# Patient Record
Sex: Male | Born: 1955 | Race: White | Hispanic: No | Marital: Married | Smoking: Never smoker
Health system: Southern US, Community
[De-identification: ages and names within clinical notes are randomized; demographics above are authoritative.]

## PROBLEM LIST (undated history)

## (undated) DIAGNOSIS — E039 Hypothyroidism, unspecified: Secondary | ICD-10-CM

## (undated) DIAGNOSIS — Z9889 Other specified postprocedural states: Secondary | ICD-10-CM

## (undated) DIAGNOSIS — I10 Essential (primary) hypertension: Secondary | ICD-10-CM

## (undated) DIAGNOSIS — R112 Nausea with vomiting, unspecified: Secondary | ICD-10-CM

## (undated) DIAGNOSIS — R0602 Shortness of breath: Secondary | ICD-10-CM

## (undated) DIAGNOSIS — M1990 Unspecified osteoarthritis, unspecified site: Secondary | ICD-10-CM

## (undated) HISTORY — PX: OTHER SURGICAL HISTORY: SHX169

## (undated) HISTORY — PX: TONSILLECTOMY: SUR1361

---

## 2012-02-11 ENCOUNTER — Other Ambulatory Visit: Payer: Self-pay | Admitting: Orthopedic Surgery

## 2012-02-11 MED ORDER — BUPIVACAINE LIPOSOME 1.3 % IJ SUSP: 20.0000 mL | Freq: Once | INTRAMUSCULAR | Status: DC

## 2012-02-11 MED ORDER — DEXAMETHASONE SODIUM PHOSPHATE 10 MG/ML IJ SOLN: 10.0000 mg | Freq: Once | INTRAMUSCULAR | Status: DC

## 2012-02-11 NOTE — Progress Notes (Signed)
Preoperative surgical orders have been place into the [facility] hospital system for Thomas Meyers on 02/11/2012, 10:44 AM  by [name] for surgery on 03/21/2012.  Preop Total Knee orders including Experal, IV Tylenol, and IV Decadron as long as there are no contraindications to the above medications. Avel Peace, PA-C .

## 2012-03-11 ENCOUNTER — Encounter (HOSPITAL_COMMUNITY): Payer: Self-pay | Admitting: Pharmacy Technician

## 2012-03-12 NOTE — Patient Instructions (Signed)
EUGUNE SINE  03/12/2012   Your procedure is scheduled on:  03/21/12  Report to [facility] at   1130  AM.  Call this number if you have problems the morning of surgery: [phone]   Remember:   Do not eat food or drink liquids after midnight.   Take these medicines the morning of surgery with A SIP OF WATER:    Do not wear jewelry,   Do not wear lotions, powders, or perfumes.   . Men may shave face and neck.  Do not bring valuables to the hospital.  Contacts, dentures or bridgework may not be worn into surgery.  Leave suitcase in the car. After surgery it may be brought to your room.  For patients admitted to the hospital, checkout time is 11:00 AM the day of  discharge.        SEE CHG INSTRUCTION SHEET    Please read over the following fact sheets that you were given: MRSA Information, coughing and deep breathing exercises, leg exercises, Blood Transfusion Fact sheet, Incentive Spirometry Fact sheet                Failure to comply with these instructions may result in cancellation of your surgery.                Patient Signature ____________________________              Nurse Signature _____________________________

## 2012-03-13 ENCOUNTER — Encounter (HOSPITAL_COMMUNITY)
Admission: RE | Admit: 2012-03-13 | Discharge: 2012-03-13 | Disposition: A | Discharge status: Home / Self Care | Payer: BC Managed Care – PPO | Source: Ambulatory Visit | Attending: Orthopedic Surgery | Admitting: Orthopedic Surgery

## 2012-03-13 ENCOUNTER — Encounter (HOSPITAL_COMMUNITY): Payer: Self-pay

## 2012-03-13 ENCOUNTER — Ambulatory Visit (HOSPITAL_COMMUNITY)
Admission: RE | Admit: 2012-03-13 | Discharge: 2012-03-13 | Disposition: A | Discharge status: Home / Self Care | Payer: BC Managed Care – PPO | Source: Ambulatory Visit | Attending: Orthopedic Surgery | Admitting: Orthopedic Surgery

## 2012-03-13 DIAGNOSIS — Z01810 Encounter for preprocedural cardiovascular examination: Secondary | ICD-10-CM | POA: Insufficient documentation

## 2012-03-13 DIAGNOSIS — I10 Essential (primary) hypertension: Secondary | ICD-10-CM | POA: Insufficient documentation

## 2012-03-13 DIAGNOSIS — M5380 Other specified dorsopathies, site unspecified: Secondary | ICD-10-CM | POA: Insufficient documentation

## 2012-03-13 DIAGNOSIS — Z01812 Encounter for preprocedural laboratory examination: Secondary | ICD-10-CM | POA: Insufficient documentation

## 2012-03-13 DIAGNOSIS — Z01818 Encounter for other preprocedural examination: Secondary | ICD-10-CM | POA: Insufficient documentation

## 2012-03-13 HISTORY — DX: Unspecified osteoarthritis, unspecified site: M19.90

## 2012-03-13 HISTORY — DX: Shortness of breath: R06.02

## 2012-03-13 HISTORY — DX: Essential (primary) hypertension: I10

## 2012-03-13 HISTORY — DX: Hypothyroidism, unspecified: E03.9

## 2012-03-13 LAB — COMPREHENSIVE METABOLIC PANEL
Albumin: 3.9 g/dL (ref 3.5–5.2)
BUN: 20 mg/dL (ref 6–23)
Chloride: 101 mEq/L (ref 96–112)
Creatinine, Ser: 0.88 mg/dL (ref 0.50–1.35)
GFR calc Af Amer: >90 mL/min (ref >90)
GFR calc non Af Amer: >90 mL/min (ref >90)
Glucose, Bld: 96 mg/dL (ref 70–99)
Total Bilirubin: 0.6 mg/dL (ref 0.3–1.2)

## 2012-03-13 LAB — CBC
HCT: 42.9 % (ref 39.0–52.0)
MCHC: 33.6 g/dL (ref 30.0–36.0)
MCV: 92.1 fL (ref 78.0–100.0)
RDW: 13.2 % (ref 11.5–15.5)
WBC: 8.6 10*3/uL (ref 4.0–10.5)

## 2012-03-13 LAB — PROTIME-INR
INR: 1.07 (ref 0.00–1.49)
Prothrombin Time: 13.8 seconds (ref 11.6–15.2)

## 2012-03-13 LAB — URINALYSIS, ROUTINE W REFLEX MICROSCOPIC
Bilirubin Urine: NEGATIVE
Ketones, ur: NEGATIVE mg/dL
Nitrite: NEGATIVE
Protein, ur: NEGATIVE mg/dL
Specific Gravity, Urine: 1.014 (ref 1.005–1.030)
Urobilinogen, UA: 0.2 mg/dL (ref 0.0–1.0)

## 2012-03-18 ENCOUNTER — Other Ambulatory Visit: Payer: Self-pay | Admitting: Orthopedic Surgery

## 2012-03-18 NOTE — H&P (Signed)
Thomas Meyers  DOB: 04/20/1955 Married / Language: English / Race: White Male  Date of Admission:  03/21/2012  Chief Complaint:  Right Knee Pain  History of Present Illness The patient is a 57 year old male who comes in  for a preoperative History and Physical. The patient is scheduled for a right total knee arthroplasty to be performed by Dr. Gus Rankin. Aluisio, MD at [facility] on 03/21/2012. The patient is a 57 year old male who presents for follow up of their knee. The patient is being followed for their bilateral knee pain and osteoarthritis. Symptoms reported today include: pain. The patient feels that they are doing poorly and report their pain level to be moderate to severe. The following medication has been used for pain control: Tylenol. He has had cortisone injections in the past as well as Synvisc. He completed his Synvisc series in the beginning of October. The left knee did respond somewhat to the injections, although he states that pain has come back. He states he feels he is still getting some relief from the Synvisc into the left knee, but he does have pain every day with weightbearing activity on the left knee. The right knee did not respond to the visco supplementation. He has significant pain with weightbearing and even at rest with the right knee. There is a lot of trouble with instability of the right knee. He has been wearing a brace which does help him feel more stable on that right knee. He is scheduled to have the right knee replaced on 03-21-12 and is ready to procedd with surgery. They have been treated conservatively in the past for the above stated problem and despite conservative measures, they continue to have progressive pain and severe functional limitations and dysfunction. They have failed non-operative management including home exercise, medications, and injections. It is felt that they would benefit from undergoing total joint replacement. Risks and  benefits of the procedure have been discussed with the patient and they elect to proceed with surgery. There are no active contraindications to surgery such as ongoing infection or rapidly progressive neurological disease.   Problem List Primary osteoarthritis of both knees (715.16)   Allergies No Known Drug Allergies   Family History Rheumatoid Arthritis. mother Hypertension. mother and father Cancer. mother and grandfather fathers side Heart Disease. father Father. Deceased, Hypertension, Alzheimer's disease, CABG, Coronary artery disease. age 73 Mother. Deceased, Cancer, Hypertension. age 1   Social History Marital status. married Number of flights of stairs before winded. 4-5 Pain Contract. no Exercise. Exercises rarely Illicit drug use. no Living situation. live with spouse Tobacco / smoke exposure. no Tobacco use. never smoker Drug/Alcohol Rehab (Previously). no Current work status. working full time Drug/Alcohol Rehab (Currently). no Children. 1 Alcohol use. current drinker; drinks beer, wine and hard liquor; only occasionally per week Post-Surgical Plans. Plan is for home.   Medication History Losartan Potassium-HCTZ (100-12.5MG  Tablet, Oral) Active. Metoprolol Succinate ER (50MG  Tablet ER 24HR, Oral) Active. Levothyroxine Sodium ( Tablet, Oral) Active. Pravastatin Sodium (40MG  Tablet, Oral) Active. Aspirin Low Dose (81MG  Tablet, Oral) Active. Tylenol Arthritis Pain ( Oral) Specific dose unknown - Active. Multivitamin ( Oral) Active.   Past Surgical History Arthroscopy of Knee. Date: 36. right   Medical History High blood pressure Hypercholesterolemia Hypothyroidism   Review of Systems General:Present- Chills (couple of weeks ago but none recently). Not Present- Fever, Night Sweats, Fatigue, Weight Gain, Weight Loss and Memory Loss. Skin:Not Present- Hives, Itching, Rash, Eczema and Lesions.  HEENT:Not  Present- Tinnitus, Headache, Double Vision, Visual Loss, Hearing Loss and Dentures. Respiratory:Not Present- Shortness of breath with exertion, Shortness of breath at rest, Allergies, Coughing up blood and Chronic Cough. Cardiovascular:Not Present- Chest Pain, Racing/skipping heartbeats, Difficulty Breathing Lying Down, Murmur, Swelling and Palpitations. Gastrointestinal:Not Present- Bloody Stool, Heartburn, Abdominal Pain, Vomiting, Nausea, Constipation, Diarrhea, Difficulty Swallowing, Jaundice and Loss of appetitie. Male Genitourinary:Not Present- Urinary frequency, Blood in Urine, Weak urinary stream, Discharge, Flank Pain, Incontinence, Painful Urination, Urgency, Urinary Retention and Urinating at Night. Musculoskeletal:Present- Joint Pain. Not Present- Muscle Weakness, Muscle Pain, Joint Swelling, Back Pain, Morning Stiffness and Spasms. Neurological:Not Present- Tremor, Dizziness, Blackout spells, Paralysis, Difficulty with balance and Weakness. Psychiatric:Not Present- Insomnia.   Vitals Weight: 270 lb Height: 71 in Body Surface Area: 2.48 m Body Mass Index: 37.66 kg/m Pulse: 76 (Regular) Resp.: 16 (Unlabored) BP: 132/80 (Sitting, Left Arm, Standard)    Physical Exam The physical exam findings are as follows:  Note: Patient is accompanied today by his wife.   General Mental Status - Alert, cooperative and good historian. General Appearance- pleasant. Not in acute distress. Orientation- Oriented X3. Build & Nutrition- Well nourished and Well developed.   Head and Neck Head- normocephalic, atraumatic . Neck Global Assessment- supple. no bruit auscultated on the right and no bruit auscultated on the left.   Eye Pupil- Bilateral- Regular and Round. Motion- Bilateral- EOMI.   Chest and Lung Exam Auscultation: Breath sounds:- clear at anterior chest wall and - clear at posterior chest wall. Adventitious sounds:- No Adventitious  sounds.   Cardiovascular Auscultation:Rhythm- Regular rate and rhythm. Heart Sounds- S1 WNL and S2 WNL. Murmurs & Other Heart Sounds:Auscultation of the heart reveals - No Murmurs.   Abdomen Palpation/Percussion:Tenderness- Abdomen is non-tender to palpation. Rigidity (guarding)- Abdomen is soft. Auscultation:Auscultation of the abdomen reveals - Bowel sounds normal.   Male Genitourinary Not done, not pertinent to present illness  Musculoskeletal On exam well developed male alert and oriented in no apparent distress. Evaluation of his left knee shows no effusion. His range of motion is about 0 to 125. Moderate crepitus on range of motion. He is tender medial greater than lateral with no instability. Right knee no effusion. Significant varus. Range 5 to 120. Marked crepitus on range of motion. Tender medial greater than lateral. No instability. Pulses, sensation and motor are intact both lower extremities.    RADIOGRAPHS: Radiographs show bone on bone arthritis medial and patellofemoral compartments of the right knee.  Assessment & Plan Osteoarthritis, Knee (715.96) Impression: Right Knee  Note: Plan is for a Right Total Knee Replacement by Dr. Lequita Halt.  Plan is to go home.  PCP - Dr. Wyvonnia Lora - Patient has been seen preoperatively and felt to be stable for surgery.  Signed electronically by Roberts Gaudy, PA-C

## 2012-03-21 ENCOUNTER — Inpatient Hospital Stay (HOSPITAL_COMMUNITY)
Admission: RE | Admit: 2012-03-21 | Discharge: 2012-03-23 | DRG: 209 | Disposition: A | Discharge status: Home Health | Payer: BC Managed Care – PPO | Source: Ambulatory Visit | Attending: Orthopedic Surgery | Admitting: Orthopedic Surgery

## 2012-03-21 ENCOUNTER — Inpatient Hospital Stay (HOSPITAL_COMMUNITY): Payer: BC Managed Care – PPO | Admitting: Anesthesiology

## 2012-03-21 ENCOUNTER — Encounter (HOSPITAL_COMMUNITY): Payer: Self-pay | Admitting: *Deleted

## 2012-03-21 ENCOUNTER — Encounter (HOSPITAL_COMMUNITY)
Admission: RE | Disposition: A | Discharge status: Home Health | Payer: Self-pay | Source: Ambulatory Visit | Attending: Orthopedic Surgery

## 2012-03-21 ENCOUNTER — Encounter (HOSPITAL_COMMUNITY): Payer: Self-pay | Admitting: Anesthesiology

## 2012-03-21 DIAGNOSIS — E7800 Pure hypercholesterolemia, unspecified: Secondary | ICD-10-CM | POA: Diagnosis present

## 2012-03-21 DIAGNOSIS — M1710 Unilateral primary osteoarthritis, unspecified knee: Principal | ICD-10-CM | POA: Diagnosis present

## 2012-03-21 DIAGNOSIS — Z7982 Long term (current) use of aspirin: Secondary | ICD-10-CM

## 2012-03-21 DIAGNOSIS — I10 Essential (primary) hypertension: Secondary | ICD-10-CM | POA: Diagnosis present

## 2012-03-21 DIAGNOSIS — E039 Hypothyroidism, unspecified: Secondary | ICD-10-CM | POA: Diagnosis present

## 2012-03-21 DIAGNOSIS — M179 Osteoarthritis of knee, unspecified: Secondary | ICD-10-CM | POA: Diagnosis present

## 2012-03-21 DIAGNOSIS — Z79899 Other long term (current) drug therapy: Secondary | ICD-10-CM

## 2012-03-21 DIAGNOSIS — Z8249 Family history of ischemic heart disease and other diseases of the circulatory system: Secondary | ICD-10-CM

## 2012-03-21 HISTORY — PX: TOTAL KNEE ARTHROPLASTY: SHX125

## 2012-03-21 LAB — ABO/RH: ABO/RH(D): AB POS

## 2012-03-21 LAB — TYPE AND SCREEN

## 2012-03-21 SURGERY — ARTHROPLASTY, KNEE, TOTAL
Anesthesia: Spinal | Site: Knee | Laterality: Right | Wound class: Clean

## 2012-03-21 MED ORDER — MIDAZOLAM HCL 5 MG/5ML IJ SOLN
INTRAMUSCULAR | Status: DC | PRN
  Administered 2012-03-21: 2 mg via INTRAVENOUS

## 2012-03-21 MED ORDER — METOPROLOL SUCCINATE ER 50 MG PO TB24
50.0000 mg | ORAL_TABLET | Freq: Every day | ORAL | Status: DC
  Filled 2012-03-21 (×2): qty 1

## 2012-03-21 MED ORDER — LOSARTAN POTASSIUM 50 MG PO TABS
100.0000 mg | ORAL_TABLET | Freq: Every day | ORAL | Status: DC
  Administered 2012-03-21: 100 mg via ORAL
  Filled 2012-03-21 (×3): qty 2

## 2012-03-21 MED ORDER — SODIUM CHLORIDE 0.9 % IV SOLN: INTRAVENOUS | Status: DC

## 2012-03-21 MED ORDER — METOCLOPRAMIDE HCL 5 MG/ML IJ SOLN: 10.0000 mg | Freq: Once | INTRAMUSCULAR | Status: DC | PRN

## 2012-03-21 MED ORDER — BISACODYL 10 MG RE SUPP: 10.0000 mg | Freq: Every day | RECTAL | Status: DC | PRN

## 2012-03-21 MED ORDER — OXYCODONE HCL 5 MG/5ML PO SOLN
5.0000 mg | Freq: Once | ORAL | Status: DC | PRN
  Filled 2012-03-21: qty 5

## 2012-03-21 MED ORDER — LACTATED RINGERS IV SOLN
INTRAVENOUS | Status: DC | PRN
  Administered 2012-03-21 (×3): via INTRAVENOUS

## 2012-03-21 MED ORDER — DEXAMETHASONE 6 MG PO TABS
10.0000 mg | ORAL_TABLET | Freq: Once | ORAL | Status: AC
  Administered 2012-03-22: 10 mg via ORAL
  Filled 2012-03-21: qty 1

## 2012-03-21 MED ORDER — SIMVASTATIN 20 MG PO TABS
20.0000 mg | ORAL_TABLET | Freq: Every day | ORAL | Status: DC
  Administered 2012-03-21 – 2012-03-22 (×2): 20 mg via ORAL
  Filled 2012-03-21 (×3): qty 1

## 2012-03-21 MED ORDER — BUPIVACAINE IN DEXTROSE 0.75-8.25 % IT SOLN
INTRATHECAL | Status: DC | PRN
  Administered 2012-03-21: 1.6 mL via INTRATHECAL

## 2012-03-21 MED ORDER — KCL IN DEXTROSE-NACL 20-5-0.9 MEQ/L-%-% IV SOLN
INTRAVENOUS | Status: DC
  Administered 2012-03-21: 17:00:00 via INTRAVENOUS
  Filled 2012-03-21 (×5): qty 1000

## 2012-03-21 MED ORDER — ACETAMINOPHEN 650 MG RE SUPP: 650.0000 mg | Freq: Four times a day (QID) | RECTAL | Status: DC | PRN

## 2012-03-21 MED ORDER — ACETAMINOPHEN 325 MG PO TABS: 650.0000 mg | ORAL_TABLET | Freq: Four times a day (QID) | ORAL | Status: DC | PRN

## 2012-03-21 MED ORDER — HYDROCHLOROTHIAZIDE 12.5 MG PO CAPS
12.5000 mg | ORAL_CAPSULE | Freq: Every day | ORAL | Status: DC
  Administered 2012-03-21: 12.5 mg via ORAL
  Filled 2012-03-21 (×3): qty 1

## 2012-03-21 MED ORDER — PHENOL 1.4 % MT LIQD: 1.0000 | OROMUCOSAL | Status: DC | PRN

## 2012-03-21 MED ORDER — PROPOFOL 10 MG/ML IV EMUL
INTRAVENOUS | Status: DC | PRN
  Administered 2012-03-21: 75 ug/kg/min via INTRAVENOUS

## 2012-03-21 MED ORDER — METOCLOPRAMIDE HCL 5 MG/ML IJ SOLN: 5.0000 mg | Freq: Three times a day (TID) | INTRAMUSCULAR | Status: DC | PRN

## 2012-03-21 MED ORDER — METHOCARBAMOL 100 MG/ML IJ SOLN
500.0000 mg | Freq: Four times a day (QID) | INTRAVENOUS | Status: DC | PRN
  Administered 2012-03-21: 500 mg via INTRAVENOUS
  Filled 2012-03-21: qty 5

## 2012-03-21 MED ORDER — SODIUM CHLORIDE 0.9 % IJ SOLN
INTRAMUSCULAR | Status: DC | PRN
  Administered 2012-03-21: 14:00:00

## 2012-03-21 MED ORDER — FLEET ENEMA 7-19 GM/118ML RE ENEM: 1.0000 | ENEMA | Freq: Once | RECTAL | Status: AC | PRN

## 2012-03-21 MED ORDER — RIVAROXABAN 10 MG PO TABS
10.0000 mg | ORAL_TABLET | Freq: Every day | ORAL | Status: DC
  Administered 2012-03-22 – 2012-03-23 (×2): 10 mg via ORAL
  Filled 2012-03-21 (×3): qty 1

## 2012-03-21 MED ORDER — OXYCODONE HCL 5 MG PO TABS
5.0000 mg | ORAL_TABLET | ORAL | Status: DC | PRN
  Administered 2012-03-21: 10 mg via ORAL
  Administered 2012-03-21: 15 mg via ORAL
  Administered 2012-03-21: 10 mg via ORAL
  Administered 2012-03-22: 15 mg via ORAL
  Administered 2012-03-22: 10 mg via ORAL
  Administered 2012-03-22: 15 mg via ORAL
  Administered 2012-03-22: 20 mg via ORAL
  Administered 2012-03-22 – 2012-03-23 (×2): 10 mg via ORAL
  Filled 2012-03-21 (×2): qty 4
  Filled 2012-03-21 (×2): qty 2
  Filled 2012-03-21: qty 4
  Filled 2012-03-21 (×3): qty 3
  Filled 2012-03-21: qty 4

## 2012-03-21 MED ORDER — DEXAMETHASONE SODIUM PHOSPHATE 10 MG/ML IJ SOLN: 10.0000 mg | Freq: Once | INTRAMUSCULAR | Status: AC

## 2012-03-21 MED ORDER — CEFAZOLIN SODIUM-DEXTROSE 2-3 GM-% IV SOLR
2.0000 g | Freq: Four times a day (QID) | INTRAVENOUS | Status: AC
  Administered 2012-03-21 – 2012-03-22 (×2): 2 g via INTRAVENOUS
  Filled 2012-03-21 (×2): qty 50

## 2012-03-21 MED ORDER — TRAMADOL HCL 50 MG PO TABS: 50.0000 mg | ORAL_TABLET | Freq: Four times a day (QID) | ORAL | Status: DC | PRN

## 2012-03-21 MED ORDER — BUPIVACAINE LIPOSOME 1.3 % IJ SUSP
20.0000 mL | Freq: Once | INTRAMUSCULAR | Status: DC
  Filled 2012-03-21: qty 20

## 2012-03-21 MED ORDER — BUPIVACAINE ON-Q PAIN PUMP (FOR ORDER SET NO CHG)
INJECTION | Status: DC
  Filled 2012-03-21: qty 1

## 2012-03-21 MED ORDER — ACETAMINOPHEN 10 MG/ML IV SOLN
1000.0000 mg | Freq: Four times a day (QID) | INTRAVENOUS | Status: AC
  Administered 2012-03-21 – 2012-03-22 (×2): 1000 mg via INTRAVENOUS
  Filled 2012-03-21 (×5): qty 100

## 2012-03-21 MED ORDER — 0.9 % SODIUM CHLORIDE (POUR BTL) OPTIME
TOPICAL | Status: DC | PRN
  Administered 2012-03-21: 1000 mL

## 2012-03-21 MED ORDER — ONDANSETRON HCL 4 MG/2ML IJ SOLN
INTRAMUSCULAR | Status: DC | PRN
  Administered 2012-03-21: 4 mg via INTRAVENOUS

## 2012-03-21 MED ORDER — METOCLOPRAMIDE HCL 10 MG PO TABS: 5.0000 mg | ORAL_TABLET | Freq: Three times a day (TID) | ORAL | Status: DC | PRN

## 2012-03-21 MED ORDER — DIPHENHYDRAMINE HCL 12.5 MG/5ML PO ELIX: 12.5000 mg | ORAL_SOLUTION | ORAL | Status: DC | PRN

## 2012-03-21 MED ORDER — MORPHINE SULFATE 2 MG/ML IJ SOLN
1.0000 mg | INTRAMUSCULAR | Status: DC | PRN
  Administered 2012-03-21: 1 mg via INTRAVENOUS
  Filled 2012-03-21 (×2): qty 1

## 2012-03-21 MED ORDER — LOSARTAN POTASSIUM-HCTZ 100-12.5 MG PO TABS: 1.0000 | ORAL_TABLET | Freq: Every day | ORAL | Status: DC

## 2012-03-21 MED ORDER — ONDANSETRON HCL 4 MG PO TABS: 4.0000 mg | ORAL_TABLET | Freq: Four times a day (QID) | ORAL | Status: DC | PRN

## 2012-03-21 MED ORDER — DEXTROSE 5 % IV SOLN
3.0000 g | INTRAVENOUS | Status: AC
  Administered 2012-03-21: 3 g via INTRAVENOUS
  Filled 2012-03-21: qty 3000

## 2012-03-21 MED ORDER — LEVOTHYROXINE SODIUM 75 MCG PO TABS
75.0000 ug | ORAL_TABLET | Freq: Every day | ORAL | Status: DC
  Administered 2012-03-22 – 2012-03-23 (×2): 75 ug via ORAL
  Filled 2012-03-21 (×3): qty 1

## 2012-03-21 MED ORDER — OXYCODONE HCL 5 MG PO TABS: 5.0000 mg | ORAL_TABLET | Freq: Once | ORAL | Status: DC | PRN

## 2012-03-21 MED ORDER — ONDANSETRON HCL 4 MG/2ML IJ SOLN: 4.0000 mg | Freq: Four times a day (QID) | INTRAMUSCULAR | Status: DC | PRN

## 2012-03-21 MED ORDER — ACETAMINOPHEN 10 MG/ML IV SOLN
1000.0000 mg | Freq: Once | INTRAVENOUS | Status: AC
  Administered 2012-03-21: 1000 mg via INTRAVENOUS

## 2012-03-21 MED ORDER — KETAMINE HCL 10 MG/ML IJ SOLN
INTRAMUSCULAR | Status: DC | PRN
  Administered 2012-03-21 (×5): 10 mg via INTRAVENOUS

## 2012-03-21 MED ORDER — MENTHOL 3 MG MT LOZG: 1.0000 | LOZENGE | OROMUCOSAL | Status: DC | PRN

## 2012-03-21 MED ORDER — HYDROMORPHONE HCL PF 1 MG/ML IJ SOLN: 0.2500 mg | INTRAMUSCULAR | Status: DC | PRN

## 2012-03-21 MED ORDER — CHLORHEXIDINE GLUCONATE 4 % EX LIQD: 60.0000 mL | Freq: Once | CUTANEOUS | Status: DC

## 2012-03-21 MED ORDER — POLYETHYLENE GLYCOL 3350 17 G PO PACK: 17.0000 g | PACK | Freq: Every day | ORAL | Status: DC | PRN

## 2012-03-21 MED ORDER — DOCUSATE SODIUM 100 MG PO CAPS
100.0000 mg | ORAL_CAPSULE | Freq: Two times a day (BID) | ORAL | Status: DC
  Administered 2012-03-21 – 2012-03-22 (×3): 100 mg via ORAL

## 2012-03-21 MED ORDER — METHOCARBAMOL 500 MG PO TABS
500.0000 mg | ORAL_TABLET | Freq: Four times a day (QID) | ORAL | Status: DC | PRN
  Administered 2012-03-21 – 2012-03-22 (×4): 500 mg via ORAL
  Filled 2012-03-21 (×4): qty 1

## 2012-03-21 SURGICAL SUPPLY — 53 items
BAG ZIPLOCK 12X15 (MISCELLANEOUS) ×2 IMPLANT
BANDAGE ELASTIC 6 VELCRO ST LF (GAUZE/BANDAGES/DRESSINGS) ×2 IMPLANT
BANDAGE ESMARK 6X9 LF (GAUZE/BANDAGES/DRESSINGS) ×1 IMPLANT
BLADE SAG 18X100X1.27 (BLADE) ×2 IMPLANT
BLADE SAW SGTL 11.0X1.19X90.0M (BLADE) ×2 IMPLANT
BNDG ESMARK 6X9 LF (GAUZE/BANDAGES/DRESSINGS) ×2
BOWL SMART MIX CTS (DISPOSABLE) ×2 IMPLANT
CEMENT HV SMART SET (Cement) ×4 IMPLANT
CLOTH BEACON ORANGE TIMEOUT ST (SAFETY) ×2 IMPLANT
CUFF TOURN SGL QUICK 34 (TOURNIQUET CUFF) ×1
CUFF TRNQT CYL 34X4X40X1 (TOURNIQUET CUFF) ×1 IMPLANT
DRAPE EXTREMITY T 121X128X90 (DRAPE) ×2 IMPLANT
DRAPE POUCH INSTRU U-SHP 10X18 (DRAPES) ×2 IMPLANT
DRAPE U-SHAPE 47X51 STRL (DRAPES) ×2 IMPLANT
DRSG ADAPTIC 3X8 NADH LF (GAUZE/BANDAGES/DRESSINGS) ×2 IMPLANT
DRSG PAD ABDOMINAL 8X10 ST (GAUZE/BANDAGES/DRESSINGS) ×2 IMPLANT
DURAPREP 26ML APPLICATOR (WOUND CARE) ×2 IMPLANT
ELECT REM PT RETURN 9FT ADLT (ELECTROSURGICAL) ×2
ELECTRODE REM PT RTRN 9FT ADLT (ELECTROSURGICAL) ×1 IMPLANT
EVACUATOR 1/8 PVC DRAIN (DRAIN) ×2 IMPLANT
FACESHIELD LNG OPTICON STERILE (SAFETY) ×10 IMPLANT
GLOVE BIO SURGEON STRL SZ7.5 (GLOVE) ×2 IMPLANT
GLOVE BIO SURGEON STRL SZ8 (GLOVE) ×2 IMPLANT
GLOVE BIOGEL PI IND STRL 8 (GLOVE) ×2 IMPLANT
GLOVE BIOGEL PI INDICATOR 8 (GLOVE) ×2
GLOVE SURG SS PI 6.5 STRL IVOR (GLOVE) ×4 IMPLANT
GOWN STRL NON-REIN LRG LVL3 (GOWN DISPOSABLE) ×4 IMPLANT
GOWN STRL REIN XL XLG (GOWN DISPOSABLE) ×4 IMPLANT
HANDPIECE INTERPULSE COAX TIP (DISPOSABLE) ×1
IMMOBILIZER KNEE 20 (SOFTGOODS) ×2
IMMOBILIZER KNEE 20 THIGH 36 (SOFTGOODS) ×1 IMPLANT
KIT BASIN OR (CUSTOM PROCEDURE TRAY) ×2 IMPLANT
MANIFOLD NEPTUNE II (INSTRUMENTS) ×2 IMPLANT
NDL SAFETY ECLIPSE 18X1.5 (NEEDLE) ×1 IMPLANT
NEEDLE HYPO 18GX1.5 SHARP (NEEDLE) ×1
NS IRRIG 1000ML POUR BTL (IV SOLUTION) ×2 IMPLANT
PACK TOTAL JOINT (CUSTOM PROCEDURE TRAY) ×2 IMPLANT
PAD ABD 7.5X8 STRL (GAUZE/BANDAGES/DRESSINGS) ×2 IMPLANT
PADDING CAST COTTON 6X4 STRL (CAST SUPPLIES) ×2 IMPLANT
POSITIONER SURGICAL ARM (MISCELLANEOUS) ×2 IMPLANT
SET HNDPC FAN SPRY TIP SCT (DISPOSABLE) ×1 IMPLANT
SPONGE GAUZE 4X4 12PLY (GAUZE/BANDAGES/DRESSINGS) ×2 IMPLANT
STRIP CLOSURE SKIN 1/2X4 (GAUZE/BANDAGES/DRESSINGS) ×4 IMPLANT
SUCTION FRAZIER 12FR DISP (SUCTIONS) ×2 IMPLANT
SUT MNCRL AB 4-0 PS2 18 (SUTURE) ×2 IMPLANT
SUT VIC AB 2-0 CT1 27 (SUTURE) ×3
SUT VIC AB 2-0 CT1 TAPERPNT 27 (SUTURE) ×3 IMPLANT
SUT VLOC 180 0 24IN GS25 (SUTURE) ×2 IMPLANT
SYR 50ML LL SCALE MARK (SYRINGE) ×2 IMPLANT
TOWEL OR 17X26 10 PK STRL BLUE (TOWEL DISPOSABLE) ×4 IMPLANT
TRAY FOLEY CATH 14FRSI W/METER (CATHETERS) ×2 IMPLANT
WATER STERILE IRR 1500ML POUR (IV SOLUTION) ×2 IMPLANT
WRAP KNEE MAXI GEL POST OP (GAUZE/BANDAGES/DRESSINGS) ×4 IMPLANT

## 2012-03-21 NOTE — H&P (View-Only) (Signed)
Thomas Meyers  DOB: 03/06/1955 Married / Language: English / Race: White Male  Date of Admission:  03/21/2012  Chief Complaint:  Right Knee Pain  History of Present Illness The patient is a 57 year old male who comes in  for a preoperative History and Physical. The patient is scheduled for a right total knee arthroplasty to be performed by Dr. Frank V. Aluisio, MD at [facility] on 03/21/2012. The patient is a 57 year old male who presents for follow up of their knee. The patient is being followed for their bilateral knee pain and osteoarthritis. Symptoms reported today include: pain. The patient feels that they are doing poorly and report their pain level to be moderate to severe. The following medication has been used for pain control: Tylenol. He has had cortisone injections in the past as well as Synvisc. He completed his Synvisc series in the beginning of October. The left knee did respond somewhat to the injections, although he states that pain has come back. He states he feels he is still getting some relief from the Synvisc into the left knee, but he does have pain every day with weightbearing activity on the left knee. The right knee did not respond to the visco supplementation. He has significant pain with weightbearing and even at rest with the right knee. There is a lot of trouble with instability of the right knee. He has been wearing a brace which does help him feel more stable on that right knee. He is scheduled to have the right knee replaced on 03-21-12 and is ready to procedd with surgery. They have been treated conservatively in the past for the above stated problem and despite conservative measures, they continue to have progressive pain and severe functional limitations and dysfunction. They have failed non-operative management including home exercise, medications, and injections. It is felt that they would benefit from undergoing total joint replacement. Risks and  benefits of the procedure have been discussed with the patient and they elect to proceed with surgery. There are no active contraindications to surgery such as ongoing infection or rapidly progressive neurological disease.   Problem List Primary osteoarthritis of both knees (715.16)   Allergies No Known Drug Allergies   Family History Rheumatoid Arthritis. mother Hypertension. mother and father Cancer. mother and grandfather fathers side Heart Disease. father Father. Deceased, Hypertension, Alzheimer's disease, CABG, Coronary artery disease. age 81 Mother. Deceased, Cancer, Hypertension. age 73   Social History Marital status. married Number of flights of stairs before winded. 4-5 Pain Contract. no Exercise. Exercises rarely Illicit drug use. no Living situation. live with spouse Tobacco / smoke exposure. no Tobacco use. never smoker Drug/Alcohol Rehab (Previously). no Current work status. working full time Drug/Alcohol Rehab (Currently). no Children. 1 Alcohol use. current drinker; drinks beer, wine and hard liquor; only occasionally per week Post-Surgical Plans. Plan is for home.   Medication History Losartan Potassium-HCTZ (100-12.5MG Tablet, Oral) Active. Metoprolol Succinate ER (50MG Tablet ER 24HR, Oral) Active. Levothyroxine Sodium (75MCG Tablet, Oral) Active. Pravastatin Sodium (40MG Tablet, Oral) Active. Aspirin Low Dose (81MG Tablet, Oral) Active. Tylenol Arthritis Pain ( Oral) Specific dose unknown - Active. Multivitamin ( Oral) Active.   Past Surgical History Arthroscopy of Knee. Date: 1986. right   Medical History High blood pressure Hypercholesterolemia Hypothyroidism   Review of Systems General:Present- Chills (couple of weeks ago but none recently). Not Present- Fever, Night Sweats, Fatigue, Weight Gain, Weight Loss and Memory Loss. Skin:Not Present- Hives, Itching, Rash, Eczema and Lesions.   HEENT:Not  Present- Tinnitus, Headache, Double Vision, Visual Loss, Hearing Loss and Dentures. Respiratory:Not Present- Shortness of breath with exertion, Shortness of breath at rest, Allergies, Coughing up blood and Chronic Cough. Cardiovascular:Not Present- Chest Pain, Racing/skipping heartbeats, Difficulty Breathing Lying Down, Murmur, Swelling and Palpitations. Gastrointestinal:Not Present- Bloody Stool, Heartburn, Abdominal Pain, Vomiting, Nausea, Constipation, Diarrhea, Difficulty Swallowing, Jaundice and Loss of appetitie. Male Genitourinary:Not Present- Urinary frequency, Blood in Urine, Weak urinary stream, Discharge, Flank Pain, Incontinence, Painful Urination, Urgency, Urinary Retention and Urinating at Night. Musculoskeletal:Present- Joint Pain. Not Present- Muscle Weakness, Muscle Pain, Joint Swelling, Back Pain, Morning Stiffness and Spasms. Neurological:Not Present- Tremor, Dizziness, Blackout spells, Paralysis, Difficulty with balance and Weakness. Psychiatric:Not Present- Insomnia.   Vitals Weight: 270 lb Height: 71 in Body Surface Area: 2.48 m Body Mass Index: 37.66 kg/m Pulse: 76 (Regular) Resp.: 16 (Unlabored) BP: 132/80 (Sitting, Left Arm, Standard)    Physical Exam The physical exam findings are as follows:  Note: Patient is accompanied today by his wife.   General Mental Status - Alert, cooperative and good historian. General Appearance- pleasant. Not in acute distress. Orientation- Oriented X3. Build & Nutrition- Well nourished and Well developed.   Head and Neck Head- normocephalic, atraumatic . Neck Global Assessment- supple. no bruit auscultated on the right and no bruit auscultated on the left.   Eye Pupil- Bilateral- Regular and Round. Motion- Bilateral- EOMI.   Chest and Lung Exam Auscultation: Breath sounds:- clear at anterior chest wall and - clear at posterior chest wall. Adventitious sounds:- No Adventitious  sounds.   Cardiovascular Auscultation:Rhythm- Regular rate and rhythm. Heart Sounds- S1 WNL and S2 WNL. Murmurs & Other Heart Sounds:Auscultation of the heart reveals - No Murmurs.   Abdomen Palpation/Percussion:Tenderness- Abdomen is non-tender to palpation. Rigidity (guarding)- Abdomen is soft. Auscultation:Auscultation of the abdomen reveals - Bowel sounds normal.   Male Genitourinary Not done, not pertinent to present illness  Musculoskeletal On exam well developed male alert and oriented in no apparent distress. Evaluation of his left knee shows no effusion. His range of motion is about 0 to 125. Moderate crepitus on range of motion. He is tender medial greater than lateral with no instability. Right knee no effusion. Significant varus. Range 5 to 120. Marked crepitus on range of motion. Tender medial greater than lateral. No instability. Pulses, sensation and motor are intact both lower extremities.    RADIOGRAPHS: Radiographs show bone on bone arthritis medial and patellofemoral compartments of the right knee.  Assessment & Plan Osteoarthritis, Knee (715.96) Impression: Right Knee  Note: Plan is for a Right Total Knee Replacement by Dr. Aluisio.  Plan is to go home.  PCP - Dr. David Tapper - Patient has been seen preoperatively and felt to be stable for surgery.  Signed electronically by DREW L PERKINS, PA-C 

## 2012-03-21 NOTE — Progress Notes (Signed)
Patient has been taking Levoquin for URI. He feels much better

## 2012-03-21 NOTE — Anesthesia Postprocedure Evaluation (Signed)
Anesthesia Post Note  Patient: Thomas Meyers  Procedure(s) Performed: Procedure(s) (LRB): TOTAL KNEE ARTHROPLASTY (Right)  Anesthesia type: Spinal  Patient location: PACU  Post pain: Pain level controlled  Post assessment: Patient's Cardiovascular Status Stable  Last Vitals:  Filed Vitals:   03/21/12 1500  BP: 112/70  Pulse: 58  Temp:   Resp: 15    Post vital signs: Reviewed and stable  Level of consciousness: alert  Complications: No apparent anesthesia complications    Motor function returned below T-12

## 2012-03-21 NOTE — Op Note (Signed)
Pre-operative diagnosis- Osteoarthritis  Right knee(s)  Post-operative diagnosis- Osteoarthritis Right knee(s)  Procedure-  Right  Total Knee Arthroplasty  Surgeon- Gus Rankin. Emy Angevine, MD  Assistant- Avel Peace, PA-C   Anesthesia-  Spinal EBL-* No blood loss amount entered *  Drains Hemovac  Tourniquet time-  Total Tourniquet Time Documented: Thigh (Right) - 43 minutes Total: Thigh (Right) - 43 minutes    Complications- None  Condition-PACU - hemodynamically stable.   Brief Clinical Note  Thomas Meyers is a 57 y.o. year old male with end stage OA of his right knee with progressively worsening pain and dysfunction. He has constant pain, with activity and at rest and significant functional deficits with difficulties even with ADLs. He has had extensive non-op management including analgesics, injections of cortisone and viscosupplements, and home exercise program, but remains in significant pain with significant dysfunction. Radiographs show bone on bone arthritis medial and patellofemoral with varus deformity and massive osteophyte formation. He presents now for right Total Knee Arthroplasty.    Procedure in detail---   The patient is brought into the operating room and positioned supine on the operating table. After successful administration of  Spinal,   a tourniquet is placed high on the  Right thigh(s) and the lower extremity is prepped and draped in the usual sterile fashion. Time out is performed by the operating team and then the  Right lower extremity is wrapped in Esmarch, knee flexed and the tourniquet inflated to 300 mmHg.       A midline incision is made with a ten blade through the subcutaneous tissue to the level of the extensor mechanism. A fresh blade is used to make a medial parapatellar arthrotomy. Soft tissue over the proximal medial tibia is subperiosteally elevated to the joint line with a knife and into the semimembranosus bursa with a Cobb elevator. Soft tissue over  the proximal lateral tibia is elevated with attention being paid to avoiding the patellar tendon on the tibial tubercle. The patella is everted, knee flexed 90 degrees and the ACL and PCL are removed. Findings are bone on bone medial and patellofemoral with massive global osteophytes.        The drill is used to create a starting hole in the distal femur and the canal is thoroughly irrigated with sterile saline to remove the fatty contents. The 5 degree Right  valgus alignment guide is placed into the femoral canal and the distal femoral cutting block is pinned to remove 11 mm off the distal femur. Resection is made with an oscillating saw.      The tibia is subluxed forward and the menisci are removed. The extramedullary alignment guide is placed referencing proximally at the medial aspect of the tibial tubercle and distally along the second metatarsal axis and tibial crest. The block is pinned to remove 2mm off the more deficient medial  side. Resection is made with an oscillating saw. Size 4is the most appropriate size for the tibia and the proximal tibia is prepared with the modular drill and keel punch for that size.      The femoral sizing guide is placed and size 5 is most appropriate. Rotation is marked off the epicondylar axis and confirmed by creating a rectangular flexion gap at 90 degrees. The size 5 cutting block is pinned in this rotation and the anterior, posterior and chamfer cuts are made with the oscillating saw. The intercondylar block is then placed and that cut is made.      Trial  size 4 tibial component, trial size 5 posterior stabilized femur and a 12.5  mm posterior stabilized rotating platform insert trial is placed. Full extension is achieved with excellent varus/valgus and anterior/posterior balance throughout full range of motion. The patella is everted and thickness measured to be 27  mm. Free hand resection is taken to 15 mm, a 41 template is placed, lug holes are drilled, trial  patella is placed, and it tracks normally. Osteophytes are removed off the posterior femur with the trial in place. All trials are removed and the cut bone surfaces prepared with pulsatile lavage. Cement is mixed and once ready for implantation, the size 4 tibial implant, size  5 posterior stabilized femoral component, and the size 41 patella are cemented in place and the patella is held with the clamp. The trial insert is placed and the knee held in full extension. The Exparel (20 ml mixed with 50 ml saline) is injected into the extensor mechanism, posterior capsule, medial and lateral gutters and subcutaneous tissues.  All extruded cement is removed and once the cement is hard the permanent 12.5 mm posterior stabilized rotating platform insert is placed into the tibial tray.      The wound is copiously irrigated with saline solution and the extensor mechanism closed over a hemovac drain with #1 PDS suture. The tourniquet is released for a total tourniquet time of 43  minutes. Flexion against gravity is 140 degrees and the patella tracks normally. Subcutaneous tissue is closed with 2.0 vicryl and subcuticular with running 4.0 Monocryl. The incision is cleaned and dried and steri-strips and a bulky sterile dressing are applied. The limb is placed into a knee immobilizer and the patient is awakened and transported to recovery in stable condition.      Please note that a surgical assistant was a medical necessity for this procedure in order to perform it in a safe and expeditious manner. Surgical assistant was necessary to retract the ligaments and vital neurovascular structures to prevent injury to them and also necessary for proper positioning of the limb to allow for anatomic placement of the prosthesis.   Gus Rankin Deirdre Gryder, MD    03/21/2012, 2:21 PM

## 2012-03-21 NOTE — Interval H&P Note (Signed)
History and Physical Interval Note:  03/21/2012 12:32 PM  Thomas Meyers  has presented today for surgery, with the diagnosis of OA OF RIGHT KNEE  The various methods of treatment have been discussed with the patient and family. After consideration of risks, benefits and other options for treatment, the patient has consented to  Procedure(s): TOTAL KNEE ARTHROPLASTY (Right) as a surgical intervention .  The patient's history has been reviewed, patient examined, no change in status, stable for surgery.  I have reviewed the patient's chart and labs.  Questions were answered to the patient's satisfaction.     Thomas Meyers

## 2012-03-21 NOTE — Anesthesia Preprocedure Evaluation (Addendum)
Anesthesia Evaluation  Patient identified by MRN, date of birth, ID band Patient awake    Reviewed: Allergy & Precautions, H&P , NPO status , Patient's Chart, lab work & pertinent test results, reviewed documented beta blocker date and time   Airway Mallampati: II TM Distance: >3 FB Neck ROM: full    Dental   Pulmonary shortness of breath and with exertion,  breath sounds clear to auscultation        Cardiovascular hypertension, On Medications and On Home Beta Blockers Rhythm:regular     Neuro/Psych negative neurological ROS  negative psych ROS   GI/Hepatic negative GI ROS, Neg liver ROS,   Endo/Other  Hypothyroidism   Renal/GU negative Renal ROS  negative genitourinary   Musculoskeletal   Abdominal   Peds  Hematology negative hematology ROS (+)   Anesthesia Other Findings See surgeon's H&P   Reproductive/Obstetrics negative OB ROS                           Anesthesia Physical Anesthesia Plan  ASA: III  Anesthesia Plan: Spinal   Post-op Pain Management:    Induction:   Airway Management Planned: Simple Face Mask  Additional Equipment:   Intra-op Plan:   Post-operative Plan:   Informed Consent: I have reviewed the patients History and Physical, chart, labs and discussed the procedure including the risks, benefits and alternatives for the proposed anesthesia with the patient or authorized representative who has indicated his/her understanding and acceptance.   Dental Advisory Given  Plan Discussed with: CRNA and Surgeon  Anesthesia Plan Comments:         Anesthesia Quick Evaluation

## 2012-03-21 NOTE — Anesthesia Procedure Notes (Signed)
Spinal  Patient location during procedure: OR Start time: 03/21/2012 1:05 PM End time: 03/21/2012 1:10 PM Staffing Performed by: anesthesiologist  Preanesthetic Checklist Completed: patient identified, site marked, surgical consent, pre-op evaluation, timeout performed, IV checked, risks and benefits discussed and monitors and equipment checked Spinal Block Patient position: sitting Prep: Betadine Patient monitoring: heart rate, cardiac monitor, continuous pulse ox and blood pressure Approach: right paramedian Location: L2-3 Needle Needle type: Spinocan  Needle gauge: 22 G Needle length: 9 cm Needle insertion depth: 6 cm Assessment Sensory level: T6

## 2012-03-21 NOTE — Preoperative (Signed)
Beta Blockers   Reason not to administer Beta Blockers:Not Applicable 

## 2012-03-21 NOTE — Transfer of Care (Signed)
Immediate Anesthesia Transfer of Care Note  Patient: Thomas Meyers  Procedure(s) Performed: Procedure(s): TOTAL KNEE ARTHROPLASTY (Right)  Patient Location: PACU  Anesthesia Type:MAC and Spinal  Level of Consciousness: awake, alert , oriented and patient cooperative  Airway & Oxygen Therapy: Patient Spontanous Breathing and Patient connected to face mask oxygen  Post-op Assessment: Report given to PACU RN and Post -op Vital signs reviewed and stable  Post vital signs: Reviewed and stable  Complications: No apparent anesthesia complications

## 2012-03-22 LAB — BASIC METABOLIC PANEL
BUN: 13 mg/dL (ref 6–23)
CO2: 27 mEq/L (ref 19–32)
Chloride: 101 mEq/L (ref 96–112)
Creatinine, Ser: 0.85 mg/dL (ref 0.50–1.35)
Glucose, Bld: 175 mg/dL — ABNORMAL HIGH (ref 70–99)

## 2012-03-22 LAB — CBC
HCT: 32.1 % — ABNORMAL LOW (ref 39.0–52.0)
Hemoglobin: 10.8 g/dL — ABNORMAL LOW (ref 13.0–17.0)
MCV: 92.5 fL (ref 78.0–100.0)
RDW: 13.1 % (ref 11.5–15.5)
WBC: 11.1 10*3/uL — ABNORMAL HIGH (ref 4.0–10.5)

## 2012-03-22 MED ORDER — RIVAROXABAN 10 MG PO TABS: 10.0000 mg | ORAL_TABLET | Freq: Every day | ORAL | Status: DC

## 2012-03-22 MED ORDER — METHOCARBAMOL 500 MG PO TABS: 500.0000 mg | ORAL_TABLET | Freq: Four times a day (QID) | ORAL | Status: DC | PRN

## 2012-03-22 MED ORDER — OXYCODONE HCL 5 MG PO TABS: 5.0000 mg | ORAL_TABLET | ORAL | Status: DC | PRN

## 2012-03-22 MED ORDER — TRAMADOL HCL 50 MG PO TABS: 50.0000 mg | ORAL_TABLET | Freq: Four times a day (QID) | ORAL | Status: AC | PRN

## 2012-03-22 NOTE — Progress Notes (Signed)
Physical Therapy Treatment Patient Details Name: Thomas Meyers MRN: [id number] DOB: 11-19-1955 Today's Date: 03/22/2012 Time: 7829-5621 PT Time Calculation (min): 35 min  PT Assessment / Plan / Recommendation Comments on Treatment Session  Pt continues to progress well with all mobility and is at supervision for most mobility.      Follow Up Recommendations  Home health PT     Does the patient have the potential to tolerate intense rehabilitation     Barriers to Discharge        Equipment Recommendations  Rolling walker with 5" wheels    Recommendations for Other Services    Frequency 7X/week   Plan Discharge plan remains appropriate    Precautions / Restrictions Precautions Precautions: Knee Required Braces or Orthoses: Knee Immobilizer - Right Knee Immobilizer - Right: Discontinue once straight leg raise with < 10 degree lag Restrictions Weight Bearing Restrictions: No Other Position/Activity Restrictions: WBAT   Pertinent Vitals/Pain 2/10 pain    Mobility  Bed Mobility Bed Mobility: Not assessed Transfers Transfers: Sit to Stand;Stand to Sit Sit to Stand: 5: Supervision;With upper extremity assist;With armrests;From chair/3-in-1 Stand to Sit: 5: Supervision;With upper extremity assist;With armrests;To chair/3-in-1 Details for Transfer Assistance: Supervision for safety with min cues for hand placement.  Ambulation/Gait Ambulation/Gait Assistance: 5: Supervision Ambulation Distance (Feet): 200 Feet Assistive device: Rolling walker Ambulation/Gait Assistance Details: Min cues for upright posture and increasing WB on RLE Gait Pattern: Step-to pattern;Decreased stride length;Trunk flexed;Antalgic Gait velocity: decreased Stairs: No    Exercises Total Joint Exercises Ankle Circles/Pumps: AROM;Both;20 reps Quad Sets: AROM;Right;10 reps Heel Slides: AAROM;Right;10 reps Hip ABduction/ADduction: AAROM;Right;10 reps Straight Leg Raises: AAROM;Right;10 reps   PT  Diagnosis:    PT Problem List:   PT Treatment Interventions:     PT Goals Acute Rehab PT Goals PT Goal Formulation: With patient/family Time For Goal Achievement: 03/24/12 Potential to Achieve Goals: Good Pt will go Sit to Stand: with modified independence PT Goal: Sit to Stand - Progress: Updated due to goal met Pt will Ambulate: >150 feet;with modified independence PT Goal: Ambulate - Progress: Progressing toward goal Pt will Perform Home Exercise Program: with supervision, verbal cues required/provided PT Goal: Perform Home Exercise Program - Progress: Progressing toward goal  Visit Information  Last PT Received On: 03/22/12 Assistance Needed: +1    Subjective Data  Subjective: Lets do this.  Patient Stated Goal: to return home.    Cognition  Cognition Overall Cognitive Status: Appears within functional limits for tasks assessed/performed Arousal/Alertness: Awake/alert Orientation Level: Appears intact for tasks assessed Behavior During Session: [facility] for tasks performed    Balance     End of Session PT - End of Session Equipment Utilized During Treatment: Right knee immobilizer Activity Tolerance: Patient tolerated treatment well Patient left: in chair;with call bell/phone within reach;with family/visitor present Nurse Communication: Mobility status   GP     Vista Deck 03/22/2012, 3:35 PM

## 2012-03-22 NOTE — Evaluation (Signed)
Occupational Therapy Evaluation Patient Details Name: Thomas Meyers MRN: [id number] DOB: 10-11-1955 Today's Date: 03/22/2012 Time: 4098-1191 OT Time Calculation (min): 19 min  OT Assessment / Plan / Recommendation Clinical Impression  This 57 y.o. male admitted for Rt. TKA. All education completed from an OT standpoint.  No further OT needs identified, will sign off.     OT Assessment  Patient needs continued OT Services    Follow Up Recommendations  No OT follow up    Barriers to Discharge      Equipment Recommendations  None recommended by OT    Recommendations for Other Services    Frequency       Precautions / Restrictions Precautions Precautions: Knee Required Braces or Orthoses: Knee Immobilizer - Right Knee Immobilizer - Right: Discontinue once straight leg raise with < 10 degree lag Restrictions Weight Bearing Restrictions: No Other Position/Activity Restrictions: WBAT       ADL  Eating/Feeding: Independent Where Assessed - Eating/Feeding: Chair Grooming: Wash/dry face;Wash/dry hands;Teeth care;Set up Where Assessed - Grooming: Supported sitting Upper Body Bathing: Set up Where Assessed - Upper Body Bathing: Supported sitting Lower Body Bathing: Minimal assistance Where Assessed - Lower Body Bathing: Supported sit to stand Upper Body Dressing: Set up Where Assessed - Upper Body Dressing: Supported sitting Lower Body Dressing: Minimal assistance Where Assessed - Lower Body Dressing: Supported sit to Pharmacist, hospital: Supervision/safety Statistician Method: Sit to Barista: Regular height toilet;Grab bars Toileting - Architect and Hygiene: Supervision/safety Where Assessed - Engineer, mining and Hygiene: Standing Equipment Used: Rolling walker Transfers/Ambulation Related to ADLs: Pt ambulates with supervision ADL Comments: Pt. able to reach to heel to push sock over it.  Pain prevents him from  donning sock today, but anticipate he will be able to do so quickly.  Pt. and wife instructed in safe technique for LB ADLs, and for pt to perform as independently as possible. Also discussed 3-in-1 vs. using vanity for support for sit to stand from toilet.  Pt. opts for sit to stand from toilet using vanity as support.  Also discussed options for tub DME and safe method for transferring.  Pt and wife will acquire a seat.       OT Diagnosis:    OT Problem List:   OT Treatment Interventions:     OT Goals    Visit Information  Last OT Received On: 03/22/12 Assistance Needed: +1    Subjective Data  Subjective: "I've been dealing with this for a long time" Patient Stated Goal: To get back to work   Prior Functioning     Home Living Lives With: Spouse Available Help at Discharge: Family;Available 24 hours/day Type of Home: House Home Access: Stairs to enter [employer] of Steps: 2 Entrance Stairs-Rails: None Home Layout: One level Bathroom Shower/Tub: Forensic scientist: Standard Bathroom Accessibility: Yes How Accessible: Accessible via walker Home Adaptive Equipment: Shower chair with back Prior Function Level of Independence: Independent Able to Take Stairs?: Yes Driving: Yes Vocation: Full time employment Communication Communication: No difficulties         Vision/Perception     Cognition  Cognition Overall Cognitive Status: Appears within functional limits for tasks assessed/performed Arousal/Alertness: Awake/alert Orientation Level: Appears intact for tasks assessed Behavior During Session: [facility] for tasks performed    Extremity/Trunk Assessment Right Upper Extremity Assessment RUE ROM/Strength/Tone: Within functional levels RUE Coordination: WFL - gross/fine motor Left Upper Extremity Assessment LUE ROM/Strength/Tone: Within functional levels LUE Coordination: WFL -  gross/fine motor Right Lower Extremity Assessment RLE  ROM/Strength/Tone: Deficits RLE ROM/Strength/Tone Deficits: ankle motions WFL, unable to fully perform SLR without assist.  RLE Sensation: WFL - Light Touch RLE Coordination: WFL - gross/fine motor Left Lower Extremity Assessment LLE ROM/Strength/Tone: WFL for tasks assessed LLE Sensation: WFL - Light Touch LLE Coordination: WFL - gross/fine motor Trunk Assessment Trunk Assessment: Normal     Mobility Bed Mobility Bed Mobility: Supine to Sit Supine to Sit: 4: Min assist Details for Bed Mobility Assistance: Very min assist for RLE out of bed with min cues for hand placement to self assist.  Transfers Transfers: Sit to Stand;Stand to Sit Sit to Stand: 5: Supervision;With upper extremity assist;From chair/3-in-1 Stand to Sit: 5: Supervision;With upper extremity assist;To chair/3-in-1 Details for Transfer Assistance: Min/guard for safety with cues for hand placement and LE management.      Exercise     Balance Balance Balance Assessed: Yes Static Standing Balance Static Standing - Balance Support: No upper extremity supported Static Standing - Level of Assistance: 5: Stand by assistance Static Standing - Comment/# of Minutes: Pt able to stand at RW and put his own shirt on at stand by assist.    End of Session OT - End of Session Equipment Utilized During Treatment: Right knee immobilizer Activity Tolerance: Patient tolerated treatment well Patient left: in chair;with call bell/phone within reach;with family/visitor present  GO     Autumn Pruitt, Ursula Alert M 03/22/2012, 3:20 PM

## 2012-03-22 NOTE — Evaluation (Signed)
Physical Therapy Evaluation Patient Details Name: Thomas Meyers MRN: [id number] DOB: 02-16-1955 Today's Date: 03/22/2012 Time: 4098-1191 PT Time Calculation (min): 30 min  PT Assessment / Plan / Recommendation Clinical Impression  Pt presents s/p R TKA POD 1 with decreased strength, ROM and mobility.  Tolerated OOB and ambulation in hallway very well at min/guard level for safety.  Pt will benefit from skilled PT in acute venue to address deficits.  PT recommends HHPT for follow up at D/C to maximize pts safety and function.     PT Assessment  Patient needs continued PT services    Follow Up Recommendations  Home health PT    Does the patient have the potential to tolerate intense rehabilitation      Barriers to Discharge None      Equipment Recommendations  Rolling walker with 5" wheels    Recommendations for Other Services OT consult   Frequency 7X/week    Precautions / Restrictions Precautions Precautions: Knee Required Braces or Orthoses: Knee Immobilizer - Right Knee Immobilizer - Right: Discontinue once straight leg raise with < 10 degree lag Restrictions Weight Bearing Restrictions: No Other Position/Activity Restrictions: WBAT   Pertinent Vitals/Pain 4/10 pain, ice packs applied      Mobility  Bed Mobility Bed Mobility: Supine to Sit Supine to Sit: 4: Min assist Details for Bed Mobility Assistance: Very min assist for RLE out of bed with min cues for hand placement to self assist.  Transfers Transfers: Sit to Stand;Stand to Sit Sit to Stand: 4: Min guard;From elevated surface;With upper extremity assist;From bed Stand to Sit: 4: Min guard;With upper extremity assist;With armrests;To chair/3-in-1 Details for Transfer Assistance: Min/guard for safety with cues for hand placement and LE management.  Ambulation/Gait Ambulation/Gait Assistance: 4: Min guard Ambulation Distance (Feet): 85 Feet Assistive device: Rolling walker Ambulation/Gait Assistance Details:  Cues for sequencing/technique with RW and to maintain upright posture.  Also min cues for not pushing RW too far ahead of him.  Gait Pattern: Step-to pattern;Decreased stride length;Trunk flexed;Antalgic Gait velocity: decreased Stairs: No Wheelchair Mobility Wheelchair Mobility: No    Exercises     PT Diagnosis: Difficulty walking;Generalized weakness;Acute pain  PT Problem List: Decreased strength;Decreased range of motion;Decreased balance;Decreased mobility;Decreased knowledge of use of DME;Decreased knowledge of precautions;Pain PT Treatment Interventions: DME instruction;Gait training;Stair training;Functional mobility training;Therapeutic activities;Therapeutic exercise;Balance training;Patient/family education   PT Goals Acute Rehab PT Goals PT Goal Formulation: With patient/family Time For Goal Achievement: 03/24/12 Potential to Achieve Goals: Good Pt will go Supine/Side to Sit: with supervision PT Goal: Supine/Side to Sit - Progress: Goal set today Pt will go Sit to Supine/Side: with supervision PT Goal: Sit to Supine/Side - Progress: Goal set today Pt will go Sit to Stand: with supervision PT Goal: Sit to Stand - Progress: Goal set today Pt will Ambulate: >150 feet;with modified independence PT Goal: Ambulate - Progress: Goal set today Pt will Go Up / Down Stairs: 1-2 stairs;with supervision;with least restrictive assistive device PT Goal: Up/Down Stairs - Progress: Goal set today Pt will Perform Home Exercise Program: with supervision, verbal cues required/provided PT Goal: Perform Home Exercise Program - Progress: Goal set today  Visit Information  Last PT Received On: 03/22/12 Assistance Needed: +1    Subjective Data  Subjective: I'm ready to get going.  Patient Stated Goal: to return home.    Prior Functioning  Home Living Lives With: Spouse Available Help at Discharge: Family;Available 24 hours/day Type of Home: House Home Access: Stairs to enter Entrance  Stairs-Number of Steps: 2 Entrance Stairs-Rails: None Home Layout: One level Bathroom Shower/Tub: Engineer, manufacturing systems: Standard Home Adaptive Equipment: Bedside commode/3-in-1 Prior Function Level of Independence: Independent Able to Take Stairs?: Yes Driving: Yes Vocation: Full time employment Communication Communication: No difficulties    Cognition  Cognition Overall Cognitive Status: Appears within functional limits for tasks assessed/performed Arousal/Alertness: Awake/alert Orientation Level: Appears intact for tasks assessed Behavior During Session: [facility] for tasks performed    Extremity/Trunk Assessment Right Lower Extremity Assessment RLE ROM/Strength/Tone: Deficits RLE ROM/Strength/Tone Deficits: ankle motions WFL, unable to fully perform SLR without assist.  RLE Sensation: WFL - Light Touch RLE Coordination: WFL - gross/fine motor Left Lower Extremity Assessment LLE ROM/Strength/Tone: WFL for tasks assessed LLE Sensation: WFL - Light Touch LLE Coordination: WFL - gross/fine motor Trunk Assessment Trunk Assessment: Normal   Balance Balance Balance Assessed: Yes Static Standing Balance Static Standing - Balance Support: No upper extremity supported Static Standing - Level of Assistance: 5: Stand by assistance Static Standing - Comment/# of Minutes: Pt able to stand at RW and put his own shirt on at stand by assist.   End of Session PT - End of Session Equipment Utilized During Treatment: Right knee immobilizer Activity Tolerance: Patient tolerated treatment well Patient left: in chair;with call bell/phone within reach;with family/visitor present Nurse Communication: Mobility status  GP     Vista Deck 03/22/2012, 11:33 AM

## 2012-03-22 NOTE — Progress Notes (Signed)
   Subjective: 1 Day Post-Op Procedure(s) (LRB): TOTAL KNEE ARTHROPLASTY (Right) Patient reports pain as 3 on 0-10 scale.   We will start therapy today.  Plan is to go Home after hospital stay.  Objective: Vital signs in last 24 hours: Temp:  [97.7 F (36.5 C)-99.9 F (37.7 C)] 98.1 F (36.7 C) (02/15 0600) Pulse Rate:  [58-86] 73 (02/15 0600) Resp:  [13-20] 16 (02/15 0739) BP: (98-137)/(64-92) 108/67 mmHg (02/15 0600) SpO2:  [93 %-100 %] 98 % (02/15 0739) Weight:  [266 lb (120.657 kg)] 266 lb (120.657 kg) (02/14 1557)  Intake/Output from previous day:  Intake/Output Summary (Last 24 hours) at 03/22/12 0845 Last data filed at 03/22/12 0500  Gross per 24 hour  Intake 2993.33 ml  Output   2045 ml  Net 948.33 ml    Intake/Output this shift:    Labs:  Recent Labs  03/22/12 0520  HGB 10.8*    Recent Labs  03/22/12 0520  WBC 11.1*  RBC 3.47*  HCT 32.1*  PLT 186    Recent Labs  03/22/12 0520  NA 133*  K 3.8  CL 101  CO2 27  BUN 13  CREATININE 0.85  GLUCOSE 175*  CALCIUM 8.1*   No results found for this basename: LABPT, INR,  in the last 72 hours  EXAM General - Patient is Alert, Appropriate and Oriented Extremity - Neurologically intact Neurovascular intact No cellulitis present Compartment soft Dressing - dressing C/D/I Motor Function - intact, moving foot and toes well on exam.  Hemovac pulled without difficulty.  Past Medical History  Diagnosis Date  . Hypertension   . Hypothyroidism   . Shortness of breath     with exertion   . Arthritis     Assessment/Plan: 1 Day Post-Op Procedure(s) (LRB): TOTAL KNEE ARTHROPLASTY (Right) Principal Problem:   OA (osteoarthritis) of knee   Advance diet Up with therapy D/C IV fluids Plan for discharge tomorrow as long as he meets PT goals  DVT Prophylaxis - Xarelto Weight-Bearing as tolerated to right leg D/C O2 and Pulse OX and try on Room Air  Aviannah Castoro V 03/22/2012, 8:45 AM

## 2012-03-23 LAB — BASIC METABOLIC PANEL
Calcium: 8.3 mg/dL — ABNORMAL LOW (ref 8.4–10.5)
GFR calc non Af Amer: >90 mL/min (ref >90)
Glucose, Bld: 164 mg/dL — ABNORMAL HIGH (ref 70–99)
Potassium: 3.8 mEq/L (ref 3.5–5.1)
Sodium: 134 mEq/L — ABNORMAL LOW (ref 135–145)

## 2012-03-23 LAB — CBC
Hemoglobin: 10 g/dL — ABNORMAL LOW (ref 13.0–17.0)
MCH: 31.9 pg (ref 26.0–34.0)
MCHC: 34.8 g/dL (ref 30.0–36.0)
Platelets: 183 10*3/uL (ref 150–400)
RBC: 3.13 MIL/uL — ABNORMAL LOW (ref 4.22–5.81)

## 2012-03-23 NOTE — Care Management (Signed)
CARE MANAGEMENT NOTE 03/23/2012  Patient:  GORGE, ALMANZA   Account Number:  [id number]  Date Initiated:  03/23/2012  Documentation initiated by:  Holly Iannaccone  Subjective/Objective Assessment:   57 yo male admitted s/p TOTAL KNEE ARTHROPLASTY (Right). PTA pt lived home with spouse.     Action/Plan:   Home with HH services.   Anticipated DC Date:  03/23/2012   Anticipated DC Plan:  HOME W HOME HEALTH SERVICES  In-house referral  NA      DC Planning Services  CM consult      Choice offered to / List presented to:  C-1 Patient   DME arranged  Levan Hurst      DME agency  [facility]     HH arranged  HH-2 PT      [facility] agency  [facility]   Status of service:  Completed, signed off Medicare Important Message given?   (If response is "NO", the following Medicare IM given date fields will be blank) Date Medicare IM given:   Date Additional Medicare IM given:    Discharge Disposition:  HOME W HOME HEALTH SERVICES  Per UR Regulation:    If discussed at Long Length of Stay Meetings, dates discussed:    Comments:  03/23/12 1109 Male Iafrate,RN,BSN [phone] cm spoke with patient with spouse at bedside concerning discharge planning. Per pt choice [facility] to provide [facility] services upon discharge.  Pt request Dyanne Iha, an [employer] [facility] employee as PT to provide services.Pt request Rw.  [facility] notified of referral. MD orders, H/P, facesheet faxed to [facility] at [phone]. confirmation received.DME delivered to bedside prior to discharge. No other HH services or DME needs stated.

## 2012-03-23 NOTE — Progress Notes (Signed)
Subjective: Patient reports no specific complaints his pain is about 1/10 this morning with physical therapy   Objective: Vital signs in last 24 hours: Temp:  [98.1 F (36.7 C)-99.2 F (37.3 C)] 98.1 F (36.7 C) (02/16 0440) Pulse Rate:  [81-92] 87 (02/16 0440) Resp:  [16-20] 16 (02/16 0800) BP: (95-123)/(54-77) 123/72 mmHg (02/16 0440) SpO2:  [95 %-97 %] 97 % (02/16 0800)  Intake/Output from previous day: 02/15 0701 - 02/16 0700 In: 920 [P.O.:920] Out: 1626 [Urine:1625; Stool:1] Intake/Output this shift:     Recent Labs  03/22/12 0520 03/23/12 0405  HGB 10.8* 10.0*    Recent Labs  03/22/12 0520 03/23/12 0405  WBC 11.1* 15.3*  RBC 3.47* 3.13*  HCT 32.1* 28.7*  PLT 186 183    Recent Labs  03/22/12 0520 03/23/12 0405  NA 133* 134*  K 3.8 3.8  CL 101 100  CO2 27 26  BUN 13 11  CREATININE 0.85 0.73  GLUCOSE 175* 164*  CALCIUM 8.1* 8.3*   No results found for this basename: LABPT, INR,  in the last 72 hours  Patient is conscious alert and appropriate sit in a bedside chair just completing physical therapy right knee wound dressing was taken down his wound was well approximate Steri-Strips there is no pressure blisters no signs of infection no drainage his calf was soft nontender his leg is neuromotor vascularly intact  Assessment/Plan: Postop day #2 status post a right total knee arthroplasty doing very well a medical standpoint and from a physical therapy standpoint  Plan discharge to home today with home health physical therapy prescription provided by Dr.Aluisio followup in 2 weeks   Thomas Meyers W 03/23/2012, 8:29 AM

## 2012-03-23 NOTE — Progress Notes (Signed)
Pt stable, d/c instructions, and scripts given with no questions/concerns voiced by pt or wife.  Pt transported via wheelchair to private vehicle with NT and wife.

## 2012-03-23 NOTE — Progress Notes (Signed)
Physical Therapy Treatment Patient Details Name: Thomas Meyers MRN: [id number] DOB: Jun 28, 1955 Today's Date: 03/23/2012 Time: 8657-8469 PT Time Calculation (min): 39 min  PT Assessment / Plan / Recommendation Comments on Treatment Session  Pt progressing very well with all mobility.  Also practiced stairs x 2 with second attempt having wife/pt return demonstration.  Ready for D/C.     Follow Up Recommendations  Home health PT     Does the patient have the potential to tolerate intense rehabilitation     Barriers to Discharge        Equipment Recommendations  Rolling walker with 5" wheels    Recommendations for Other Services    Frequency 7X/week   Plan Discharge plan remains appropriate    Precautions / Restrictions Precautions Precautions: Knee Restrictions Weight Bearing Restrictions: No Other Position/Activity Restrictions: WBAT   Pertinent Vitals/Pain 2/10 pain, but c/o increased "soreness"    Mobility  Bed Mobility Bed Mobility: Not assessed Transfers Transfers: Sit to Stand;Stand to Sit Sit to Stand: 6: Modified independent (Device/Increase time) Stand to Sit: 6: Modified independent (Device/Increase time) Ambulation/Gait Ambulation/Gait Assistance: 6: Modified independent (Device/Increase time) Assistive device: Rolling walker Gait Pattern: Step-to pattern;Decreased stride length;Trunk flexed;Antalgic Gait velocity: decreased Stairs: Yes Stairs Assistance: 4: Min guard Stair Management Technique: No rails;Step to pattern;Backwards;Forwards;With walker Number of Stairs: 2 (x 2 reps) Wheelchair Mobility Wheelchair Mobility: No    Exercises Total Joint Exercises Ankle Circles/Pumps: AROM;Both;20 reps Quad Sets: AROM;Right;10 reps Heel Slides: AAROM;Right;10 reps Hip ABduction/ADduction: AAROM;Right;10 reps Straight Leg Raises: AAROM;Right;10 reps   PT Diagnosis:    PT Problem List:   PT Treatment Interventions:     PT Goals Acute Rehab PT Goals PT  Goal Formulation: With patient/family Time For Goal Achievement: 03/24/12 Potential to Achieve Goals: Good Pt will go Sit to Stand: with modified independence PT Goal: Sit to Stand - Progress: Met Pt will Ambulate: >150 feet;with modified independence PT Goal: Ambulate - Progress: Met Pt will Go Up / Down Stairs: 1-2 stairs;with supervision;with least restrictive assistive device PT Goal: Up/Down Stairs - Progress: Met Pt will Perform Home Exercise Program: with supervision, verbal cues required/provided PT Goal: Perform Home Exercise Program - Progress: Met  Visit Information  Last PT Received On: 03/23/12 Assistance Needed: +1    Subjective Data  Subjective: I'm going home today.  Patient Stated Goal: to return home.    Cognition  Cognition Overall Cognitive Status: Appears within functional limits for tasks assessed/performed Arousal/Alertness: Awake/alert Orientation Level: Appears intact for tasks assessed Behavior During Session: [facility] for tasks performed    Balance     End of Session PT - End of Session Activity Tolerance: Patient tolerated treatment well Patient left: in chair;with call bell/phone within reach;with family/visitor present Nurse Communication: Mobility status   GP     Vista Deck 03/23/2012, 9:31 AM

## 2012-03-24 ENCOUNTER — Encounter (HOSPITAL_COMMUNITY): Payer: Self-pay | Admitting: Orthopedic Surgery

## 2012-04-10 NOTE — Discharge Summary (Signed)
Physician Discharge Summary   Patient ID: Thomas Meyers MRN: [id number] DOB/AGE: 1955-05-29 57 y.o.  Admit date: 03/21/2012 Discharge date: 03/23/2012  Primary Diagnosis:  Osteoarthritis Right knee  Admission Diagnoses:  Past Medical History  Diagnosis Date  . Hypertension   . Hypothyroidism   . Shortness of breath     with exertion   . Arthritis    Discharge Diagnoses:   Principal Problem:   OA (osteoarthritis) of knee  Estimated body mass index is 37.12 kg/(m^2) as calculated from the following:   Height as of this encounter: 5\' 11"  (1.803 m).   Weight as of this encounter: 120.657 kg (266 lb).  Classification of overweight in adults according to BMI (WHO, 1998)   Procedure:  Procedure(s) (LRB): TOTAL KNEE ARTHROPLASTY (Right)   Consults: None  HPI: Thomas Meyers is a 57 y.o. year old male with end stage OA of his right knee with progressively worsening pain and dysfunction. He has constant pain, with activity and at rest and significant functional deficits with difficulties even with ADLs. He has had extensive non-op management including analgesics, injections of cortisone and viscosupplements, and home exercise program, but remains in significant pain with significant dysfunction. Radiographs show bone on bone arthritis medial and patellofemoral with varus deformity and massive osteophyte formation. He presents now for right Total Knee Arthroplasty.   Laboratory Data: Admission on 03/21/2012, Discharged on 03/23/2012  Component Date Value Range Status  . ABO/RH(D) 03/21/2012 AB POS   Final  . Antibody Screen 03/21/2012 NEG   Final  . Sample Expiration 03/21/2012 03/24/2012   Final  . ABO/RH(D) 03/21/2012 AB POS   Final  . WBC 03/22/2012 11.1* 4.0 - 10.5 K/uL Final  . RBC 03/22/2012 3.47* 4.22 - 5.81 MIL/uL Final  . Hemoglobin 03/22/2012 10.8* 13.0 - 17.0 g/dL Final  . HCT 11/91/4782 32.1* 39.0 - 52.0 % Final  . MCV 03/22/2012 92.5  78.0 - 100.0 fL Final  . MCH  03/22/2012 31.1  26.0 - 34.0 pg Final  . MCHC 03/22/2012 33.6  30.0 - 36.0 g/dL Final  . RDW 95/62/1308 13.1  11.5 - 15.5 % Final  . Platelets 03/22/2012 186  150 - 400 K/uL Final  . Sodium 03/22/2012 133* 135 - 145 mEq/L Final  . Potassium 03/22/2012 3.8  3.5 - 5.1 mEq/L Final  . Chloride 03/22/2012 101  96 - 112 mEq/L Final  . CO2 03/22/2012 27  19 - 32 mEq/L Final  . Glucose, Bld 03/22/2012 175* 70 - 99 mg/dL Final  . BUN 65/78/4696 13  6 - 23 mg/dL Final  . Creatinine, Ser 03/22/2012 0.85  0.50 - 1.35 mg/dL Final  . Calcium 29/52/8413 8.1* 8.4 - 10.5 mg/dL Final  . GFR calc non Af Amer 03/22/2012 >90  >90 mL/min Final  . GFR calc Af Amer 03/22/2012 >90  >90 mL/min Final   Comment:                                 The eGFR has been calculated                          using the CKD EPI equation.                          This calculation has not been  validated in all clinical                          situations.                          eGFR's persistently                          <90 mL/min signify                          possible Chronic Kidney Disease.  . WBC 03/23/2012 15.3* 4.0 - 10.5 K/uL Final  . RBC 03/23/2012 3.13* 4.22 - 5.81 MIL/uL Final  . Hemoglobin 03/23/2012 10.0* 13.0 - 17.0 g/dL Final  . HCT 78/29/5621 28.7* 39.0 - 52.0 % Final  . MCV 03/23/2012 91.7  78.0 - 100.0 fL Final  . MCH 03/23/2012 31.9  26.0 - 34.0 pg Final  . MCHC 03/23/2012 34.8  30.0 - 36.0 g/dL Final  . RDW 30/86/5784 12.9  11.5 - 15.5 % Final  . Platelets 03/23/2012 183  150 - 400 K/uL Final  . Sodium 03/23/2012 134* 135 - 145 mEq/L Final  . Potassium 03/23/2012 3.8  3.5 - 5.1 mEq/L Final  . Chloride 03/23/2012 100  96 - 112 mEq/L Final  . CO2 03/23/2012 26  19 - 32 mEq/L Final  . Glucose, Bld 03/23/2012 164* 70 - 99 mg/dL Final  . BUN 69/62/9528 11  6 - 23 mg/dL Final  . Creatinine, Ser 03/23/2012 0.73  0.50 - 1.35 mg/dL Final  . Calcium 41/32/4401 8.3* 8.4 - 10.5 mg/dL  Final  . GFR calc non Af Amer 03/23/2012 >90  >90 mL/min Final  . GFR calc Af Amer 03/23/2012 >90  >90 mL/min Final   Comment:                                 The eGFR has been calculated                          using the CKD EPI equation.                          This calculation has not been                          validated in all clinical                          situations.                          eGFR's persistently                          <90 mL/min signify                          possible Chronic Kidney Disease.  Hospital Outpatient Visit on 03/13/2012  Component Date Value Range Status  . aPTT 03/13/2012 33  24 - 37 seconds Final  . WBC 03/13/2012 8.6  4.0 - 10.5 K/uL Final  . RBC 03/13/2012 4.66  4.22 - 5.81 MIL/uL Final  . Hemoglobin 03/13/2012 14.4  13.0 - 17.0 g/dL Final  . HCT 32/44/0102 42.9  39.0 - 52.0 % Final  . MCV 03/13/2012 92.1  78.0 - 100.0 fL Final  . MCH 03/13/2012 30.9  26.0 - 34.0 pg Final  . MCHC 03/13/2012 33.6  30.0 - 36.0 g/dL Final  . RDW 72/53/6644 13.2  11.5 - 15.5 % Final  . Platelets 03/13/2012 249  150 - 400 K/uL Final  . Sodium 03/13/2012 138  135 - 145 mEq/L Final  . Potassium 03/13/2012 4.0  3.5 - 5.1 mEq/L Final  . Chloride 03/13/2012 101  96 - 112 mEq/L Final  . CO2 03/13/2012 26  19 - 32 mEq/L Final  . Glucose, Bld 03/13/2012 96  70 - 99 mg/dL Final  . BUN 03/47/4259 20  6 - 23 mg/dL Final  . Creatinine, Ser 03/13/2012 0.88  0.50 - 1.35 mg/dL Final  . Calcium 56/38/7564 9.4  8.4 - 10.5 mg/dL Final  . Total Protein 03/13/2012 7.8  6.0 - 8.3 g/dL Final  . Albumin 33/29/5188 3.9  3.5 - 5.2 g/dL Final  . AST 41/66/0630 17  0 - 37 U/L Final  . ALT 03/13/2012 15  0 - 53 U/L Final  . Alkaline Phosphatase 03/13/2012 83  39 - 117 U/L Final  . Total Bilirubin 03/13/2012 0.6  0.3 - 1.2 mg/dL Final  . GFR calc non Af Amer 03/13/2012 >90  >90 mL/min Final  . GFR calc Af Amer 03/13/2012 >90  >90 mL/min Final   Comment:                                  The eGFR has been calculated                          using the CKD EPI equation.                          This calculation has not been                          validated in all clinical                          situations.                          eGFR's persistently                          <90 mL/min signify                          possible Chronic Kidney Disease.  Marland Kitchen Prothrombin Time 03/13/2012 13.8  11.6 - 15.2 seconds Final  . INR 03/13/2012 1.07  0.00 - 1.49 Final  . Color, Urine 03/13/2012 YELLOW  YELLOW Final  . APPearance 03/13/2012 CLEAR  CLEAR Final  . Specific Gravity, Urine 03/13/2012 1.014  1.005 - 1.030 Final  . pH 03/13/2012 5.5  5.0 - 8.0 Final  . Glucose, UA 03/13/2012 NEGATIVE  NEGATIVE mg/dL Final  . Hgb urine dipstick 03/13/2012 NEGATIVE  NEGATIVE Final  . Bilirubin Urine 03/13/2012 NEGATIVE  NEGATIVE Final  . Ketones, ur  03/13/2012 NEGATIVE  NEGATIVE mg/dL Final  . Protein, ur 16/11/9602 NEGATIVE  NEGATIVE mg/dL Final  . Urobilinogen, UA 03/13/2012 0.2  0.0 - 1.0 mg/dL Final  . Nitrite 54/10/8117 NEGATIVE  NEGATIVE Final  . Leukocytes, UA 03/13/2012 NEGATIVE  NEGATIVE Final   MICROSCOPIC NOT DONE ON URINES WITH NEGATIVE PROTEIN, BLOOD, LEUKOCYTES, NITRITE, OR GLUCOSE <1000 mg/dL.  Marland Kitchen MRSA, PCR 03/13/2012 NEGATIVE  NEGATIVE Final  . Staphylococcus aureus 03/13/2012 NEGATIVE  NEGATIVE Final   Comment:                                 The Xpert SA Assay (FDA                          approved for NASAL specimens                          in patients over 72 years of age),                          is one component of                          a comprehensive surveillance                          program.  Test performance has                          been validated by [employer] for patients greater                          than or equal to 20 year old.                          It is not intended                          to diagnose  infection nor to                          guide or monitor treatment.     X-Rays:Dg Chest 2 View  03/13/2012  *RADIOLOGY REPORT*  Clinical Data: Preoperative assessment for right knee surgery, hypertension  CHEST - 2 VIEW  Comparison: None  Findings: Normal heart size, mediastinal contours, and pulmonary vascularity. Lungs clear. No pleural effusion or pneumothorax. Scattered endplate spur formation thoracic spine.  IMPRESSION: No acute abnormalities.   Original Report Authenticated By: Ulyses Southward, M.D.     EKG: Orders placed during the hospital encounter of 03/21/12  . EKG     Hospital Course: Thomas Meyers is a 57 y.o. who was admitted to [facility]. They were brought to the operating room on 03/21/2012 and underwent Procedure(s): TOTAL KNEE ARTHROPLASTY.  Patient tolerated the procedure well and was later transferred to the recovery room and then to the orthopaedic floor for postoperative care.  They were given PO and IV analgesics for pain control  following their surgery.  They were given 24 hours of postoperative antibiotics of  Anti-infectives   Start     Dose/Rate Route Frequency Ordered Stop   03/21/12 2000  ceFAZolin (ANCEF) IVPB 2 g/50 mL premix     2 g 100 mL/hr over 30 Minutes Intravenous Every 6 hours 03/21/12 1600 03/22/12 0309   03/21/12 1101  ceFAZolin (ANCEF) 3 g in dextrose 5 % 50 mL IVPB     3 g 160 mL/hr over 30 Minutes Intravenous On call to O.R. 03/21/12 1101 03/21/12 1306     and started on DVT prophylaxis in the form of Xarelto.   PT and OT were ordered for total joint protocol.  Discharge planning consulted to help with postop disposition and equipment needs.  Patient had a good night on the evening of surgery.  They started to get up OOB with therapy on day one. Hemovac drain was pulled without difficulty.  Continued to work with therapy into day two.  Dressing was changed on day two and the incision was healing well.   Patient was seen in rounds and was  ready to go home later that same day.   Discharge Medications: Prior to Admission medications   Medication Sig Start Date End Date Taking? Authorizing Provider  acetaminophen (TYLENOL) 500 MG tablet Take 1,000 mg by mouth every 6 (six) hours as needed. Pain   Yes Historical Provider, MD  aspirin EC 81 MG tablet Take 81 mg by mouth daily.   Yes Historical Provider, MD  levofloxacin (LEVAQUIN) 750 MG tablet Take 750 mg by mouth daily.   Yes Historical Provider, MD  levothyroxine (SYNTHROID, LEVOTHROID) 75 MCG tablet Take 75 mcg by mouth daily before breakfast.   Yes Historical Provider, MD  losartan-hydrochlorothiazide (HYZAAR) 100-12.5 MG per tablet Take 1 tablet by mouth daily before breakfast.   Yes Historical Provider, MD  methocarbamol (ROBAXIN) 500 MG tablet Take 1 tablet (500 mg total) by mouth every 6 (six) hours as needed. 03/22/12   Loanne Drilling, MD  metoprolol succinate (TOPROL-XL) 50 MG 24 hr tablet Take 50 mg by mouth daily before breakfast. Take with or immediately following a meal.   Yes Historical Provider, MD  oxyCODONE (OXY IR/ROXICODONE) 5 MG immediate release tablet Take 1-4 tablets (5-20 mg total) by mouth every 3 (three) hours as needed. 03/22/12   Loanne Drilling, MD  pravastatin (PRAVACHOL) 40 MG tablet Take 40 mg by mouth every evening.   Yes Historical Provider, MD  rivaroxaban (XARELTO) 10 MG TABS tablet Take 1 tablet (10 mg total) by mouth daily with breakfast. 03/22/12   Loanne Drilling, MD  traMADol (ULTRAM) 50 MG tablet Take 1-2 tablets (50-100 mg total) by mouth every 6 (six) hours as needed. 03/22/12   Loanne Drilling, MD    Diet: Cardiac diet Activity:WBAT Follow-up:in 2 weeks Disposition - Home Discharged Condition: good   Discharge Orders   Future Orders Complete By Expires     Call MD / Call 911  As directed     Comments:      If you experience chest pain or shortness of breath, CALL 911 and be transported to the hospital emergency room.  If you  develope a fever above 101 F, pus (white drainage) or increased drainage or redness at the wound, or calf pain, call your surgeon's office.    Change dressing  As directed     Comments:      Change dressing daily with sterile 4 x  4 inch gauze dressing and apply TED hose.  You may clean the incision with alcohol prior to redressing.    Discharge instructions  As directed     Comments:      Call [phone] for a followup appointment in 2 weeks with Dr. Lequita Halt    Do not put a pillow under the knee. Place it under the heel.  As directed     Increase activity slowly as tolerated  As directed     TED hose  As directed     Comments:      Use stockings (TED hose) for 3  weeks on both leg(s).  You may remove them at night for sleeping.        Medication List    TAKE these medications       acetaminophen 500 MG tablet  Commonly known as:  TYLENOL  Take 1,000 mg by mouth every 6 (six) hours as needed. Pain     aspirin EC 81 MG tablet  Take 81 mg by mouth daily.     levofloxacin 750 MG tablet  Commonly known as:  LEVAQUIN  Take 750 mg by mouth daily.     levothyroxine 75 MCG tablet  Commonly known as:  SYNTHROID, LEVOTHROID  Take 75 mcg by mouth daily before breakfast.     losartan-hydrochlorothiazide 100-12.5 MG per tablet  Commonly known as:  HYZAAR  Take 1 tablet by mouth daily before breakfast.     methocarbamol 500 MG tablet  Commonly known as:  ROBAXIN  Take 1 tablet (500 mg total) by mouth every 6 (six) hours as needed.     metoprolol succinate 50 MG 24 hr tablet  Commonly known as:  TOPROL-XL  Take 50 mg by mouth daily before breakfast. Take with or immediately following a meal.     oxyCODONE 5 MG immediate release tablet  Commonly known as:  Oxy IR/ROXICODONE  Take 1-4 tablets (5-20 mg total) by mouth every 3 (three) hours as needed.     pravastatin 40 MG tablet  Commonly known as:  PRAVACHOL  Take 40 mg by mouth every evening.     rivaroxaban 10 MG Tabs tablet    Commonly known as:  XARELTO  Take 1 tablet (10 mg total) by mouth daily with breakfast.     traMADol 50 MG tablet  Commonly known as:  ULTRAM  Take 1-2 tablets (50-100 mg total) by mouth every 6 (six) hours as needed.           Follow-up Information   Follow up with Loanne Drilling, MD. Schedule an appointment as soon as possible for a visit on 04/03/2012. (Call [phone] Monday to make the appointment)    Contact information:   [address] [place] [address] [phone]       Signed: [name] 04/10/2012, 8:50 AM

## 2016-04-16 ENCOUNTER — Other Ambulatory Visit: Payer: Self-pay | Admitting: Neurosurgery

## 2016-04-17 ENCOUNTER — Other Ambulatory Visit: Payer: Self-pay | Admitting: Neurosurgery

## 2016-04-17 DIAGNOSIS — M4317 Spondylolisthesis, lumbosacral region: Secondary | ICD-10-CM

## 2016-04-26 ENCOUNTER — Ambulatory Visit
Admission: RE | Admit: 2016-04-26 | Discharge: 2016-04-26 | Disposition: A | Discharge status: Home / Self Care | Payer: BC Managed Care – PPO | Source: Ambulatory Visit | Attending: Neurosurgery | Admitting: Neurosurgery

## 2016-04-26 ENCOUNTER — Ambulatory Visit
Admission: RE | Admit: 2016-04-26 | Discharge: 2016-04-26 | Disposition: A | Discharge status: Home / Self Care | Payer: Worker's Compensation | Source: Ambulatory Visit | Attending: Neurosurgery | Admitting: Neurosurgery

## 2016-04-26 DIAGNOSIS — M4317 Spondylolisthesis, lumbosacral region: Secondary | ICD-10-CM

## 2016-04-26 MED ORDER — DIAZEPAM 5 MG PO TABS
10.0000 mg | ORAL_TABLET | Freq: Once | ORAL | Status: AC
  Administered 2016-04-26: 10 mg via ORAL

## 2016-04-26 MED ORDER — IOPAMIDOL (ISOVUE-M 200) INJECTION 41%
15.0000 mL | Freq: Once | INTRAMUSCULAR | Status: AC
  Administered 2016-04-26: 15 mL via INTRATHECAL

## 2016-04-26 MED ORDER — ONDANSETRON HCL 4 MG/2ML IJ SOLN: 4.0000 mg | Freq: Four times a day (QID) | INTRAMUSCULAR | Status: DC | PRN

## 2016-04-26 NOTE — Discharge Instructions (Signed)
Myelogram Discharge Instructions  1. Go home and rest quietly for the next 24 hours.  It is important to lie flat for the next 24 hours.  Get up only to go to the restroom.  You may lie in the bed or on a couch on your back, your stomach, your left side or your right side.  You may have one pillow under your head.  You may have pillows between your knees while you are on your side or under your knees while you are on your back.  2. DO NOT drive today.  Recline the seat as far back as it will go, while still wearing your seat belt, on the way home.  3. You may get up to go to the bathroom as needed.  You may sit up for 10 minutes to eat.  You may resume your normal diet and medications unless otherwise indicated.  Drink plenty of extra fluids today and tomorrow.  4. The incidence of a spinal headache with nausea and/or vomiting is about 5% (one in 20 patients).  If you develop a headache, lie flat and drink plenty of fluids until the headache goes away.  Caffeinated beverages may be helpful.  If you develop severe nausea and vomiting or a headache that does not go away with flat bed rest, call [phone].  5. You may resume normal activities after your 24 hours of bed rest is over; however, do not exert yourself strongly or do any heavy lifting tomorrow.  6. Call your physician for a follow-up appointment.    You may resume Tramadol on Friday, April 27, 2016 after 1:00pm.

## 2016-04-26 NOTE — Progress Notes (Signed)
Patient states he has been off Tramadol for at least the past two days.  jkl 

## 2016-05-30 ENCOUNTER — Other Ambulatory Visit: Payer: Self-pay | Admitting: Neurosurgery

## 2016-06-13 ENCOUNTER — Other Ambulatory Visit: Payer: Self-pay

## 2016-06-13 ENCOUNTER — Encounter (HOSPITAL_COMMUNITY): Payer: Self-pay

## 2016-06-13 ENCOUNTER — Encounter (HOSPITAL_COMMUNITY)
Admission: RE | Admit: 2016-06-13 | Discharge: 2016-06-13 | Disposition: A | Discharge status: Home / Self Care | Payer: Worker's Compensation | Source: Ambulatory Visit | Attending: Neurosurgery | Admitting: Neurosurgery

## 2016-06-13 DIAGNOSIS — M4316 Spondylolisthesis, lumbar region: Secondary | ICD-10-CM | POA: Insufficient documentation

## 2016-06-13 DIAGNOSIS — R001 Bradycardia, unspecified: Secondary | ICD-10-CM | POA: Diagnosis not present

## 2016-06-13 HISTORY — DX: Nausea with vomiting, unspecified: R11.2

## 2016-06-13 HISTORY — DX: Nausea with vomiting, unspecified: Z98.890

## 2016-06-13 LAB — CBC WITH DIFFERENTIAL/PLATELET
Basophils Absolute: 0 10*3/uL (ref 0.0–0.1)
Basophils Relative: 0 %
EOS PCT: 4 %
Eosinophils Absolute: 0.3 10*3/uL (ref 0.0–0.7)
HEMATOCRIT: 40.6 % (ref 39.0–52.0)
HEMOGLOBIN: 13.4 g/dL (ref 13.0–17.0)
LYMPHS ABS: 1.8 10*3/uL (ref 0.7–4.0)
LYMPHS PCT: 29 %
MCH: 29.7 pg (ref 26.0–34.0)
MCHC: 33 g/dL (ref 30.0–36.0)
MCV: 90 fL (ref 78.0–100.0)
Monocytes Absolute: 0.5 10*3/uL (ref 0.1–1.0)
Monocytes Relative: 8 %
Neutro Abs: 3.6 10*3/uL (ref 1.7–7.7)
Neutrophils Relative %: 59 %
Platelets: 178 10*3/uL (ref 150–400)
RBC: 4.51 MIL/uL (ref 4.22–5.81)
RDW: 13.5 % (ref 11.5–15.5)
WBC: 6.2 10*3/uL (ref 4.0–10.5)

## 2016-06-13 LAB — SURGICAL PCR SCREEN
MRSA, PCR: NEGATIVE
Staphylococcus aureus: NEGATIVE

## 2016-06-13 LAB — BASIC METABOLIC PANEL
ANION GAP: 5 (ref 5–15)
BUN: 15 mg/dL (ref 6–20)
CO2: 29 mmol/L (ref 22–32)
Calcium: 9.3 mg/dL (ref 8.9–10.3)
Chloride: 104 mmol/L (ref 101–111)
Creatinine, Ser: 0.93 mg/dL (ref 0.61–1.24)
Glucose, Bld: 99 mg/dL (ref 65–99)
POTASSIUM: 3.8 mmol/L (ref 3.5–5.1)
SODIUM: 138 mmol/L (ref 135–145)

## 2016-06-13 LAB — TYPE AND SCREEN
ABO/RH(D): AB POS
Antibody Screen: NEGATIVE

## 2016-06-13 LAB — ABO/RH: ABO/RH(D): AB POS

## 2016-06-13 NOTE — Progress Notes (Signed)
   06/13/16 1452  OBSTRUCTIVE SLEEP APNEA  Have you ever been diagnosed with sleep apnea through a sleep study? No  Do you snore loudly (loud enough to be heard through closed doors)?  0  Do you often feel tired, fatigued, or sleepy during the daytime (such as falling asleep during driving or talking to someone)? 0  Has anyone observed you stop breathing during your sleep? 0  Do you have, or are you being treated for high blood pressure? 1  BMI more than 35 kg/m2? 1  Age > 50 (1-yes) 1  Neck circumference greater than:Male 16 inches or larger, Male 17inches or larger? 1  Male Gender (Yes=1) 1  Obstructive Sleep Apnea Score 5  Score 5 or greater  Results sent to PCP

## 2016-06-13 NOTE — Pre-Procedure Instructions (Addendum)
Thomas DrillingJohn F Meyers  06/13/2016      [id number] - [place], [place] - [address] [facility] [address] [place] [id number] Phone: [phone] Fax: [phone]    Your procedure is scheduled on  Friday  06/22/16  [facility]  Call this number if you have problems the morning of surgery:  [phone]   Remember:  Do not eat food or drink liquids after midnight.  Take these medicines the morning of surgery with A SIP OF WATER   LEVOTHYROXINE, METOPROLOL(TOPROL), TRAMADOL IF NEEDED                                                                                                             (STOP 7 DAYS PRIOR TO SURGERY- ASPIRIN OR ASPIRIN PRODUCTS, IBUPROFEN/ ADVIL/ MOTRIN, NAPROXEN/ ALEVE, GOODY POWDERS, BC'S, HERBAL MEDICINES)   Do not wear jewelry, make-up or nail polish.  Do not wear lotions, powders, or perfumes, or deoderant.  Do not shave 48 hours prior to surgery.  Men may shave face and neck.  Do not bring valuables to the hospital.  [facility] for any belongings or valuables.  Contacts, dentures or bridgework may not be worn into surgery.  Leave your suitcase in the car.  After surgery it may be brought to your room.  For patients admitted to the hospital, discharge time will be determined by your treatment team.  Patients discharged the day of surgery will not be allowed to drive home.   Name and phone number of your driver:    Special instructions:  [facility] - Preparing for Surgery  Before surgery, you can play an important role.  Because skin is not sterile, your skin needs to be as free of germs as possible.  You can reduce the number of germs on you skin by washing with CHG (chlorahexidine gluconate) soap before surgery.  CHG is an antiseptic cleaner which kills germs and bonds with the skin to continue killing germs even after washing.  Please DO NOT use if you have an  allergy to CHG or antibacterial soaps.  If your skin becomes reddened/irritated stop using the CHG and inform your nurse when you arrive at Short Stay.  Do not shave (including legs and underarms) for at least 48 hours prior to the first CHG shower.  You may shave your face.  Please follow these instructions carefully:   1.  Shower with CHG Soap the night before surgery and the                                morning of Surgery.  2.  If you choose to wash your hair, wash your hair first as usual with your       normal shampoo.  3.  After you shampoo, rinse your hair and body thoroughly to remove the  Shampoo.  4.  Use CHG as you would any other liquid soap.  You can apply chg directly       to the skin and wash gently with scrungie or a clean washcloth.  5.  Apply the CHG Soap to your body ONLY FROM THE NECK DOWN.        Do not use on open wounds or open sores.  Avoid contact with your eyes,       ears, mouth and genitals (private parts).  Wash genitals (private parts)       with your normal soap.  6.  Wash thoroughly, paying special attention to the area where your surgery        will be performed.  7.  Thoroughly rinse your body with warm water from the neck down.  8.  DO NOT shower/wash with your normal soap after using and rinsing off       the CHG Soap.  9.  Pat yourself dry with a clean towel.            10.  Wear clean pajamas.            11.  Place clean sheets on your bed the night of your first shower and do not        sleep with pets.  Day of Surgery  Do not apply any lotions/deoderants the morning of surgery.  Please wear clean clothes to the hospital/surgery center.    Please read over the following fact sheets that you were given. MRSA Information and Surgical Site Infection Prevention

## 2016-06-21 MED ORDER — CEFAZOLIN SODIUM 10 G IJ SOLR
3.0000 g | INTRAMUSCULAR | Status: AC
  Administered 2016-06-22: 3 g via INTRAVENOUS
  Filled 2016-06-21: qty 3000

## 2016-06-22 ENCOUNTER — Inpatient Hospital Stay (HOSPITAL_COMMUNITY): Payer: Worker's Compensation

## 2016-06-22 ENCOUNTER — Inpatient Hospital Stay (HOSPITAL_COMMUNITY): Payer: Worker's Compensation | Admitting: Certified Registered Nurse Anesthetist

## 2016-06-22 ENCOUNTER — Inpatient Hospital Stay (HOSPITAL_COMMUNITY)
Admission: RE | Admit: 2016-06-22 | Discharge: 2016-06-23 | DRG: 455 | Disposition: A | Discharge status: Home Health | Payer: Worker's Compensation | Source: Ambulatory Visit | Attending: Neurosurgery | Admitting: Neurosurgery

## 2016-06-22 ENCOUNTER — Encounter (HOSPITAL_COMMUNITY)
Admission: RE | Disposition: A | Discharge status: Home Health | Payer: Self-pay | Source: Ambulatory Visit | Attending: Neurosurgery

## 2016-06-22 ENCOUNTER — Encounter (HOSPITAL_COMMUNITY): Payer: Self-pay | Admitting: *Deleted

## 2016-06-22 DIAGNOSIS — M5136 Other intervertebral disc degeneration, lumbar region: Secondary | ICD-10-CM | POA: Diagnosis present

## 2016-06-22 DIAGNOSIS — Z6839 Body mass index (BMI) 39.0-39.9, adult: Secondary | ICD-10-CM

## 2016-06-22 DIAGNOSIS — Z720 Tobacco use: Secondary | ICD-10-CM | POA: Diagnosis not present

## 2016-06-22 DIAGNOSIS — M545 Low back pain: Secondary | ICD-10-CM | POA: Diagnosis present

## 2016-06-22 DIAGNOSIS — Z7982 Long term (current) use of aspirin: Secondary | ICD-10-CM

## 2016-06-22 DIAGNOSIS — I10 Essential (primary) hypertension: Secondary | ICD-10-CM | POA: Diagnosis present

## 2016-06-22 DIAGNOSIS — Z419 Encounter for procedure for purposes other than remedying health state, unspecified: Secondary | ICD-10-CM

## 2016-06-22 DIAGNOSIS — M4316 Spondylolisthesis, lumbar region: Principal | ICD-10-CM | POA: Diagnosis present

## 2016-06-22 DIAGNOSIS — Z96651 Presence of right artificial knee joint: Secondary | ICD-10-CM | POA: Diagnosis present

## 2016-06-22 DIAGNOSIS — E039 Hypothyroidism, unspecified: Secondary | ICD-10-CM | POA: Diagnosis present

## 2016-06-22 DIAGNOSIS — M4317 Spondylolisthesis, lumbosacral region: Secondary | ICD-10-CM | POA: Diagnosis present

## 2016-06-22 DIAGNOSIS — M4310 Spondylolisthesis, site unspecified: Secondary | ICD-10-CM | POA: Diagnosis present

## 2016-06-22 DIAGNOSIS — Z79899 Other long term (current) drug therapy: Secondary | ICD-10-CM | POA: Diagnosis not present

## 2016-06-22 DIAGNOSIS — M48061 Spinal stenosis, lumbar region without neurogenic claudication: Secondary | ICD-10-CM | POA: Diagnosis present

## 2016-06-22 SURGERY — POSTERIOR LUMBAR FUSION 2 LEVEL
Anesthesia: General | Site: Back

## 2016-06-22 MED ORDER — ROCURONIUM BROMIDE 10 MG/ML (PF) SYRINGE
PREFILLED_SYRINGE | INTRAVENOUS | Status: AC
  Filled 2016-06-22: qty 5

## 2016-06-22 MED ORDER — ONDANSETRON HCL 4 MG/2ML IJ SOLN: 4.0000 mg | Freq: Four times a day (QID) | INTRAMUSCULAR | Status: DC | PRN

## 2016-06-22 MED ORDER — OXYCODONE HCL 5 MG PO TABS: 5.0000 mg | ORAL_TABLET | Freq: Once | ORAL | Status: DC | PRN

## 2016-06-22 MED ORDER — METOPROLOL SUCCINATE ER 50 MG PO TB24
50.0000 mg | ORAL_TABLET | Freq: Every day | ORAL | Status: DC
  Administered 2016-06-23: 50 mg via ORAL
  Filled 2016-06-22: qty 1

## 2016-06-22 MED ORDER — PRAVASTATIN SODIUM 40 MG PO TABS
40.0000 mg | ORAL_TABLET | Freq: Every evening | ORAL | Status: DC
  Administered 2016-06-22: 40 mg via ORAL
  Filled 2016-06-22: qty 1

## 2016-06-22 MED ORDER — HYDROMORPHONE HCL 1 MG/ML IJ SOLN
INTRAMUSCULAR | Status: AC
  Filled 2016-06-22: qty 0.5

## 2016-06-22 MED ORDER — BUPIVACAINE HCL (PF) 0.25 % IJ SOLN
INTRAMUSCULAR | Status: AC
  Filled 2016-06-22: qty 30

## 2016-06-22 MED ORDER — EPHEDRINE 5 MG/ML INJ
INTRAVENOUS | Status: AC
  Filled 2016-06-22: qty 10

## 2016-06-22 MED ORDER — PHENYLEPHRINE HCL 10 MG/ML IJ SOLN
INTRAMUSCULAR | Status: DC | PRN
  Administered 2016-06-22: 20 ug/min via INTRAVENOUS

## 2016-06-22 MED ORDER — LOSARTAN POTASSIUM-HCTZ 100-12.5 MG PO TABS: 1.0000 | ORAL_TABLET | Freq: Every day | ORAL | Status: DC

## 2016-06-22 MED ORDER — PHENYLEPHRINE 40 MCG/ML (10ML) SYRINGE FOR IV PUSH (FOR BLOOD PRESSURE SUPPORT)
PREFILLED_SYRINGE | INTRAVENOUS | Status: AC
  Filled 2016-06-22: qty 10

## 2016-06-22 MED ORDER — ONDANSETRON HCL 4 MG/2ML IJ SOLN
INTRAMUSCULAR | Status: AC
  Filled 2016-06-22: qty 2

## 2016-06-22 MED ORDER — VANCOMYCIN HCL 1000 MG IV SOLR
INTRAVENOUS | Status: AC
  Filled 2016-06-22: qty 1000

## 2016-06-22 MED ORDER — ONDANSETRON HCL 4 MG/2ML IJ SOLN
INTRAMUSCULAR | Status: DC | PRN
  Administered 2016-06-22: 4 mg via INTRAVENOUS

## 2016-06-22 MED ORDER — DIAZEPAM 5 MG PO TABS
5.0000 mg | ORAL_TABLET | Freq: Four times a day (QID) | ORAL | Status: DC | PRN
  Administered 2016-06-22: 10 mg via ORAL
  Administered 2016-06-23: 5 mg via ORAL
  Administered 2016-06-23: 10 mg via ORAL
  Filled 2016-06-22: qty 1
  Filled 2016-06-22 (×2): qty 2

## 2016-06-22 MED ORDER — ALBUMIN HUMAN 5 % IV SOLN
INTRAVENOUS | Status: DC | PRN
  Administered 2016-06-22: 11:00:00 via INTRAVENOUS

## 2016-06-22 MED ORDER — SODIUM CHLORIDE 0.9 % IV SOLN
INTRAVENOUS | Status: DC | PRN
  Administered 2016-06-22: 11:00:00 via INTRAVENOUS

## 2016-06-22 MED ORDER — SUGAMMADEX SODIUM 200 MG/2ML IV SOLN
INTRAVENOUS | Status: DC | PRN
  Administered 2016-06-22: 200 mg via INTRAVENOUS
  Administered 2016-06-22: 100 mg via INTRAVENOUS

## 2016-06-22 MED ORDER — OXYCODONE HCL 5 MG/5ML PO SOLN: 5.0000 mg | Freq: Once | ORAL | Status: DC | PRN

## 2016-06-22 MED ORDER — LEVOTHYROXINE SODIUM 75 MCG PO TABS
75.0000 ug | ORAL_TABLET | Freq: Every day | ORAL | Status: DC
  Administered 2016-06-23: 75 ug via ORAL
  Filled 2016-06-22: qty 1

## 2016-06-22 MED ORDER — LACTATED RINGERS IV SOLN
INTRAVENOUS | Status: DC
  Administered 2016-06-22 (×2): via INTRAVENOUS

## 2016-06-22 MED ORDER — BACITRACIN 50000 UNITS IM SOLR
INTRAMUSCULAR | Status: DC | PRN
  Administered 2016-06-22: 07:00:00

## 2016-06-22 MED ORDER — HYDROMORPHONE HCL 1 MG/ML IJ SOLN
0.5000 mg | INTRAMUSCULAR | Status: DC | PRN
  Administered 2016-06-22: 1 mg via INTRAVENOUS
  Filled 2016-06-22: qty 1

## 2016-06-22 MED ORDER — LIDOCAINE 2% (20 MG/ML) 5 ML SYRINGE
INTRAMUSCULAR | Status: DC | PRN
  Administered 2016-06-22: 80 mg via INTRAVENOUS

## 2016-06-22 MED ORDER — HYDROCODONE-ACETAMINOPHEN 10-325 MG PO TABS
1.0000 | ORAL_TABLET | ORAL | Status: DC | PRN
  Administered 2016-06-22 – 2016-06-23 (×3): 2 via ORAL
  Filled 2016-06-22 (×3): qty 2

## 2016-06-22 MED ORDER — PHENOL 1.4 % MT LIQD: 1.0000 | OROMUCOSAL | Status: DC | PRN

## 2016-06-22 MED ORDER — MIDAZOLAM HCL 2 MG/2ML IJ SOLN
INTRAMUSCULAR | Status: DC | PRN
Start: 2016-06-22 — End: 2016-06-22
  Administered 2016-06-22: 2 mg via INTRAVENOUS

## 2016-06-22 MED ORDER — SODIUM CHLORIDE 0.9% FLUSH
3.0000 mL | Freq: Two times a day (BID) | INTRAVENOUS | Status: DC
  Administered 2016-06-22: 3 mL via INTRAVENOUS

## 2016-06-22 MED ORDER — 0.9 % SODIUM CHLORIDE (POUR BTL) OPTIME
TOPICAL | Status: DC | PRN
  Administered 2016-06-22: 1000 mL

## 2016-06-22 MED ORDER — ONDANSETRON HCL 4 MG PO TABS
4.0000 mg | ORAL_TABLET | Freq: Four times a day (QID) | ORAL | Status: DC | PRN
Start: 2016-06-22 — End: 2016-06-23
  Administered 2016-06-22: 4 mg via ORAL
  Filled 2016-06-22: qty 1

## 2016-06-22 MED ORDER — THROMBIN 5000 UNITS EX SOLR
CUTANEOUS | Status: AC
  Filled 2016-06-22: qty 5000

## 2016-06-22 MED ORDER — PROPOFOL 10 MG/ML IV BOLUS
INTRAVENOUS | Status: DC | PRN
  Administered 2016-06-22: 140 mg via INTRAVENOUS

## 2016-06-22 MED ORDER — FENTANYL CITRATE (PF) 250 MCG/5ML IJ SOLN
INTRAMUSCULAR | Status: AC
  Filled 2016-06-22: qty 5

## 2016-06-22 MED ORDER — LOSARTAN POTASSIUM 50 MG PO TABS
100.0000 mg | ORAL_TABLET | Freq: Every day | ORAL | Status: DC
  Administered 2016-06-23: 100 mg via ORAL
  Filled 2016-06-22: qty 2

## 2016-06-22 MED ORDER — SUCCINYLCHOLINE CHLORIDE 200 MG/10ML IV SOSY
PREFILLED_SYRINGE | INTRAVENOUS | Status: AC
  Filled 2016-06-22: qty 10

## 2016-06-22 MED ORDER — HYDROCHLOROTHIAZIDE 12.5 MG PO CAPS
12.5000 mg | ORAL_CAPSULE | Freq: Every day | ORAL | Status: DC
  Administered 2016-06-23: 12.5 mg via ORAL
  Filled 2016-06-22: qty 1

## 2016-06-22 MED ORDER — CEFAZOLIN SODIUM-DEXTROSE 1-4 GM/50ML-% IV SOLN
1.0000 g | Freq: Three times a day (TID) | INTRAVENOUS | Status: AC
  Administered 2016-06-22 (×2): 1 g via INTRAVENOUS
  Filled 2016-06-22 (×2): qty 50

## 2016-06-22 MED ORDER — MIDAZOLAM HCL 2 MG/2ML IJ SOLN
INTRAMUSCULAR | Status: AC
  Filled 2016-06-22: qty 2

## 2016-06-22 MED ORDER — LIDOCAINE 2% (20 MG/ML) 5 ML SYRINGE
INTRAMUSCULAR | Status: AC
  Filled 2016-06-22: qty 5

## 2016-06-22 MED ORDER — THROMBIN 20000 UNITS EX SOLR
CUTANEOUS | Status: DC | PRN
  Administered 2016-06-22: 08:00:00 via TOPICAL

## 2016-06-22 MED ORDER — CHLORHEXIDINE GLUCONATE CLOTH 2 % EX PADS: 6.0000 | MEDICATED_PAD | Freq: Once | CUTANEOUS | Status: DC

## 2016-06-22 MED ORDER — HYDROMORPHONE HCL 1 MG/ML IJ SOLN
0.2500 mg | INTRAMUSCULAR | Status: DC | PRN
  Administered 2016-06-22 (×3): 0.5 mg via INTRAVENOUS

## 2016-06-22 MED ORDER — FENTANYL CITRATE (PF) 250 MCG/5ML IJ SOLN
INTRAMUSCULAR | Status: DC | PRN
  Administered 2016-06-22: 50 ug via INTRAVENOUS
  Administered 2016-06-22: 100 ug via INTRAVENOUS
  Administered 2016-06-22: 150 ug via INTRAVENOUS
  Administered 2016-06-22: 50 ug via INTRAVENOUS

## 2016-06-22 MED ORDER — SODIUM CHLORIDE 0.9% FLUSH: 3.0000 mL | INTRAVENOUS | Status: DC | PRN

## 2016-06-22 MED ORDER — THROMBIN 20000 UNITS EX SOLR
CUTANEOUS | Status: AC
  Filled 2016-06-22: qty 20000

## 2016-06-22 MED ORDER — BUPIVACAINE HCL (PF) 0.25 % IJ SOLN
INTRAMUSCULAR | Status: DC | PRN
  Administered 2016-06-22: 20 mL

## 2016-06-22 MED ORDER — VANCOMYCIN HCL 1000 MG IV SOLR
INTRAVENOUS | Status: DC | PRN
  Administered 2016-06-22: 1000 mg

## 2016-06-22 MED ORDER — PROPOFOL 10 MG/ML IV BOLUS
INTRAVENOUS | Status: AC
  Filled 2016-06-22: qty 20

## 2016-06-22 MED ORDER — DEXAMETHASONE SODIUM PHOSPHATE 10 MG/ML IJ SOLN
10.0000 mg | INTRAMUSCULAR | Status: AC
  Administered 2016-06-22: 10 mg via INTRAVENOUS
  Filled 2016-06-22: qty 1

## 2016-06-22 MED ORDER — MENTHOL 3 MG MT LOZG: 1.0000 | LOZENGE | OROMUCOSAL | Status: DC | PRN

## 2016-06-22 MED ORDER — ROCURONIUM BROMIDE 10 MG/ML (PF) SYRINGE
PREFILLED_SYRINGE | INTRAVENOUS | Status: DC | PRN
  Administered 2016-06-22: 10 mg via INTRAVENOUS
  Administered 2016-06-22: 30 mg via INTRAVENOUS
  Administered 2016-06-22: 100 mg via INTRAVENOUS
  Administered 2016-06-22: 20 mg via INTRAVENOUS

## 2016-06-22 MED FILL — Heparin Sodium (Porcine) Inj 1000 Unit/ML: INTRAMUSCULAR | Qty: 30 | Status: AC

## 2016-06-22 MED FILL — Sodium Chloride IV Soln 0.9%: INTRAVENOUS | Qty: 1000 | Status: AC

## 2016-06-22 SURGICAL SUPPLY — 68 items
BAG DECANTER FOR FLEXI CONT (MISCELLANEOUS) ×3 IMPLANT
BENZOIN TINCTURE PRP APPL 2/3 (GAUZE/BANDAGES/DRESSINGS) ×3 IMPLANT
BLADE CLIPPER SURG (BLADE) ×3 IMPLANT
BUR CUTTER 7.0 ROUND (BURR) ×3 IMPLANT
BUR MATCHSTICK NEURO 3.0 LAGG (BURR) ×3 IMPLANT
CANISTER SUCT 3000ML PPV (MISCELLANEOUS) ×3 IMPLANT
CAP LCK SPNE (Orthopedic Implant) ×6 IMPLANT
CAP LOCK SPINE RADIUS (Orthopedic Implant) ×6 IMPLANT
CAP LOCKING (Orthopedic Implant) ×12 IMPLANT
CARTRIDGE OIL MAESTRO DRILL (MISCELLANEOUS) ×1 IMPLANT
CLOSURE WOUND 1/2 X4 (GAUZE/BANDAGES/DRESSINGS) ×1
CONT SPEC 4OZ CLIKSEAL STRL BL (MISCELLANEOUS) ×3 IMPLANT
COVER BACK TABLE 60X90IN (DRAPES) ×3 IMPLANT
CROSSLINK VARIABLE M-A (Orthopedic Implant) ×3 IMPLANT
DECANTER SPIKE VIAL GLASS SM (MISCELLANEOUS) ×3 IMPLANT
DERMABOND ADVANCED (GAUZE/BANDAGES/DRESSINGS) ×2
DERMABOND ADVANCED .7 DNX12 (GAUZE/BANDAGES/DRESSINGS) ×1 IMPLANT
DEVICE INTERBODY ELEVATE 23X8 (Cage) ×4 IMPLANT
DEVICE INTERBODY ELEVATE 23X9 (Cage) ×6 IMPLANT
DIFFUSER DRILL AIR PNEUMATIC (MISCELLANEOUS) ×3 IMPLANT
DRAPE C-ARM 42X72 X-RAY (DRAPES) ×6 IMPLANT
DRAPE HALF SHEET 40X57 (DRAPES) IMPLANT
DRAPE LAPAROTOMY 100X72X124 (DRAPES) ×3 IMPLANT
DRAPE POUCH INSTRU U-SHP 10X18 (DRAPES) ×3 IMPLANT
DRAPE SURG 17X23 STRL (DRAPES) ×12 IMPLANT
DRSG OPSITE POSTOP 4X6 (GAUZE/BANDAGES/DRESSINGS) ×3 IMPLANT
DURAPREP 26ML APPLICATOR (WOUND CARE) ×3 IMPLANT
ELECT REM PT RETURN 9FT ADLT (ELECTROSURGICAL) ×3
ELECTRODE REM PT RTRN 9FT ADLT (ELECTROSURGICAL) ×1 IMPLANT
EVACUATOR 1/8 PVC DRAIN (DRAIN) IMPLANT
GAUZE SPONGE 4X4 12PLY STRL (GAUZE/BANDAGES/DRESSINGS) ×3 IMPLANT
GAUZE SPONGE 4X4 16PLY XRAY LF (GAUZE/BANDAGES/DRESSINGS) IMPLANT
GLOVE BIO SURGEON STRL SZ8 (GLOVE) ×3 IMPLANT
GLOVE ECLIPSE 7.5 STRL STRAW (GLOVE) ×12 IMPLANT
GLOVE ECLIPSE 9.0 STRL (GLOVE) ×6 IMPLANT
GLOVE EXAM NITRILE LRG STRL (GLOVE) IMPLANT
GLOVE EXAM NITRILE XL STR (GLOVE) IMPLANT
GLOVE EXAM NITRILE XS STR PU (GLOVE) IMPLANT
GLOVE INDICATOR 7.5 STRL GRN (GLOVE) ×6 IMPLANT
GLOVE INDICATOR 8.0 STRL GRN (GLOVE) ×3 IMPLANT
GOWN STRL REUS W/ TWL LRG LVL3 (GOWN DISPOSABLE) IMPLANT
GOWN STRL REUS W/ TWL XL LVL3 (GOWN DISPOSABLE) ×3 IMPLANT
GOWN STRL REUS W/TWL 2XL LVL3 (GOWN DISPOSABLE) ×3 IMPLANT
GOWN STRL REUS W/TWL LRG LVL3 (GOWN DISPOSABLE)
GOWN STRL REUS W/TWL XL LVL3 (GOWN DISPOSABLE) ×6
KIT BASIN OR (CUSTOM PROCEDURE TRAY) ×3 IMPLANT
KIT ROOM TURNOVER OR (KITS) ×3 IMPLANT
NEEDLE HYPO 22GX1.5 SAFETY (NEEDLE) ×3 IMPLANT
NS IRRIG 1000ML POUR BTL (IV SOLUTION) ×3 IMPLANT
OIL CARTRIDGE MAESTRO DRILL (MISCELLANEOUS) ×3
PACK LAMINECTOMY NEURO (CUSTOM PROCEDURE TRAY) ×3 IMPLANT
ROD 5.5X60MM GREEN (Rod) ×3 IMPLANT
ROD 5.5X60MM PURPLE (Rod) ×3 IMPLANT
SCREW 5.75X40M (Screw) ×6 IMPLANT
SCREW 6.75X40MM (Screw) ×6 IMPLANT
SCREW 6.75X50MM (Screw) ×6 IMPLANT
SPACER SPNL XLORDOTIC 23X8X (Cage) ×2 IMPLANT
SPCR SPNL XLORDOTIC 23X8X (Cage) ×2 IMPLANT
SPONGE SURGIFOAM ABS GEL 100 (HEMOSTASIS) ×3 IMPLANT
STRIP CLOSURE SKIN 1/2X4 (GAUZE/BANDAGES/DRESSINGS) ×2 IMPLANT
SUT VIC AB 0 CT1 18XCR BRD8 (SUTURE) ×2 IMPLANT
SUT VIC AB 0 CT1 8-18 (SUTURE) ×4
SUT VIC AB 2-0 CT1 18 (SUTURE) ×6 IMPLANT
SUT VIC AB 3-0 SH 8-18 (SUTURE) ×6 IMPLANT
TOWEL GREEN STERILE (TOWEL DISPOSABLE) ×3 IMPLANT
TOWEL GREEN STERILE FF (TOWEL DISPOSABLE) ×2 IMPLANT
TRAY FOLEY W/METER SILVER 16FR (SET/KITS/TRAYS/PACK) ×3 IMPLANT
WATER STERILE IRR 1000ML POUR (IV SOLUTION) ×3 IMPLANT

## 2016-06-22 NOTE — Anesthesia Preprocedure Evaluation (Signed)
Anesthesia Evaluation  Patient identified by MRN, date of birth, ID band Patient awake    Reviewed: Allergy & Precautions, NPO status , Patient's Chart, lab work & pertinent test results  History of Anesthesia Complications (+) PONV and history of anesthetic complications  Airway Mallampati: III  TM Distance: >3 FB Neck ROM: Full    Dental  (+) Teeth Intact   Pulmonary shortness of breath,    breath sounds clear to auscultation       Cardiovascular hypertension, Pt. on medications  Rhythm:Regular     Neuro/Psych negative neurological ROS  negative psych ROS   GI/Hepatic negative GI ROS, Neg liver ROS,   Endo/Other  Hypothyroidism Morbid obesity  Renal/GU negative Renal ROS     Musculoskeletal  (+) Arthritis ,   Abdominal   Peds  Hematology   Anesthesia Other Findings   Reproductive/Obstetrics                             Anesthesia Physical Anesthesia Plan  ASA: III  Anesthesia Plan: General   Post-op Pain Management:    Induction: Intravenous  Airway Management Planned: Oral ETT  Additional Equipment: None  Intra-op Plan:   Post-operative Plan: Extubation in OR  Informed Consent: I have reviewed the patients History and Physical, chart, labs and discussed the procedure including the risks, benefits and alternatives for the proposed anesthesia with the patient or authorized representative who has indicated his/her understanding and acceptance.   Dental advisory given  Plan Discussed with: CRNA and Surgeon  Anesthesia Plan Comments:         Anesthesia Quick Evaluation

## 2016-06-22 NOTE — Anesthesia Postprocedure Evaluation (Addendum)
Anesthesia Post Note  Patient: Thomas DrillingJohn F Meyers  Procedure(s) Performed: Procedure(s) (LRB): POSTERIOR LUMBAR INTERBODY FUSION - LUMBAR FOUR-FIVE LUMBAR FIVE-SACRAL ONE (N/A)  Patient location during evaluation: PACU Anesthesia Type: General Level of consciousness: awake and alert Pain management: pain level controlled Vital Signs Assessment: post-procedure vital signs reviewed and stable Respiratory status: spontaneous breathing, nonlabored ventilation, respiratory function stable and patient connected to nasal cannula oxygen Cardiovascular status: blood pressure returned to baseline and stable Postop Assessment: no signs of nausea or vomiting Anesthetic complications: no       Last Vitals:  Vitals:   06/22/16 1413 06/22/16 1642  BP: 121/60 120/69  Pulse: 64 74  Resp: 18 18  Temp: 36.7 C 36.8 C    Last Pain:  Vitals:   06/22/16 1535  TempSrc:   PainSc: 10-Worst pain ever                 Marche Hottenstein

## 2016-06-22 NOTE — Transfer of Care (Signed)
Immediate Anesthesia Transfer of Care Note  Patient: Thomas Meyers  Procedure(s) Performed: Procedure(s): POSTERIOR LUMBAR INTERBODY FUSION - LUMBAR FOUR-FIVE LUMBAR FIVE-SACRAL ONE (N/A)  Patient Location: PACU  Anesthesia Type:General  Level of Consciousness: drowsy and patient cooperative  Airway & Oxygen Therapy: Patient Spontanous Breathing and Patient connected to nasal cannula oxygen  Post-op Assessment: Report given to RN, Post -op Vital signs reviewed and stable and Patient moving all extremities X 4  Post vital signs: Reviewed and stable  Last Vitals:  Vitals:   06/22/16 0654 06/22/16 1200  BP: 133/73 99/87  Pulse: (!) 59 77  Resp: 20 11  Temp: 36.7 C 36.9 C    Last Pain:  Vitals:   06/22/16 1200  TempSrc:   PainSc: (P) Asleep         Complications: No apparent anesthesia complications

## 2016-06-22 NOTE — Op Note (Signed)
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  L5-S1 Gill procedure with bilateral L5 and S1 decompressive foraminotomies, more than would be required for simple interbody fusion alone.  L4-5 and L5-S1 posterior lumbar interbody fusion utilizing interbody expandable cages, locally harvested autograft,  L4-5 S1 posterior lateral arthrodesis utilizing segmental pedicle screw fixation and local autograft.  Surgeon:Pritesh Sobecki A.Audery Wassenaar, M.D.  Asst. Surgeon: Yetta Barre  Anesthesia: General  Indication:61 year old male with back and bilateral lower extremity symptoms left greater than right beginning after work-related injury and failing conservative management. Workup demonstrates evidence of an L4-5 degenerative spondylolisthesis with instability on flexion and extension. Patient has a unilateral pars defect on the left with early grade 1 lytic spondylolisthesis and spina bifida occulta. Patient has failed conservative management presents now for two-level decompression and fusion in hopes of improving his symptoms.  Operative note:after induction anesthesia, patient position prone onto Wilson frame appropriate padded. Patient's lumbar region prepped and draped sterilely. Incision made from L4-S1. Dissection performed bilaterally. Retractor placed. Fluoroscopy used. Levels confirmed. Bilateral decompressive laminotomies and foraminotomies performed at L4-5 by removing the inferior two thirds the lamina of L4 the entire pars and interfere facet of L4 bilaterally. The majority of the superior facet of L5 was resected bilaterally. Ligament flavum elevated and resected. At  L5-S1 there was a defect in the patient's posterior laminar arch and spinous processes. This is dissected free. The left hemilaminaand defective pars and inferior facet was dissected free and removed. Foraminotomy was completed on the course exiting L5 and S1 nerve roots on the left side. Bilateral decompression then performed by performing a laminectomy on the right at L5 and resecting the pars and inferior facet of L5. Super facetectomy performed bilaterally. Foraminotomies completed on the course of each nerve root. Bilateral discectomies then performed at L4-5 and L5-S1. Disc spaces were then prepared for interbody fusion. Distractors were patient patient's right side. Disc space was cleaned of all soft tissue. 9 mm standard expandable Medtronic cage was impacted into place and expanded to its full extent on the left at L4-5. 8 mm lordotic cage was impacted into place at L5-S1 and expanded to its full extent. Distractor removed patient's right side. Disc spaces prepared on the right side. Soft tissue removed and interspace. Morselize autograft packed in the interspace at both L4-5 and L5-S1. Cages that were then impacted into place at both levels and expanded to their full extent. Pedicles of L4-L5 and S1 were then verified using surface landmarks and intraoperative fluoroscopy. Sufficient bone from the pedicle was removed using high-speed drill. Each pedicles and probed using pedicle awl each pedicle awl track was then probed and found to be solidly within the bone. Each pedicle awl track was then tapped with a screw tap. Each tap hole was then probed and found to be solidly within bone. Radius brand screws from [employer] were placed bilaterally at L4-L5 and S1. Final images revealed good position of the cages and the hardware at proper upper level with normal alignment spine. Wounds and irrigated with and bike solution. Gelfoam was placed topically for hemostasis. Vancomycin powder was placed the deep  wound space. Morselize autograft was packed posterior laterally for later fusion. Short segment titanium rod placed over the screw heads at  L4-L5 and S1. Locking caps placed over the screw heads. Locking caps and engaged with the construct under mild compression. A transverse connector was placed. Wounds and close in layers with Vicryl sutures. Steri-Strips and sterile dressing were applied. No apparent complications. Patient tolerated the procedure well and he returns to the recovery room postop.

## 2016-06-22 NOTE — Evaluation (Signed)
Occupational Therapy Evaluation and Discharge Patient Details Name: Thomas DrillingJohn F Meyers MRN: [id number] DOB: [birth year] Today's Date: 06/22/2016    History of Present Illness Pt is a 61 y/o male s/p L4-S1 PLIF. PMH including but not limited to HTN and R TKA in 2014.   Clinical Impression   Pt reports he was independent with ADL PTA. Currently pt min guard with ADL and functional mobility with the exception of min assist for LB dressing. All back, safety, and ADL education completed with pt. Pt planning to d/c home with 24/7 supervision from family. No further acute OT needs identified; signing off at this time. Please re-consult if needs change. Thank you for this referral.    Follow Up Recommendations  No OT follow up;Supervision/Assistance - 24 hour (initially)    Equipment Recommendations  None recommended by OT    Recommendations for Other Services       Precautions / Restrictions Precautions Precautions: Back Precaution Booklet Issued: Yes (comment) Precaution Comments: Pt able to recall 2/3 precautions; reviewed all with pt and wife Required Braces or Orthoses: Spinal Brace Spinal Brace: Lumbar corset;Applied in sitting position Restrictions Weight Bearing Restrictions: No      Mobility Bed Mobility        General bed mobility comments: Pt OOB in chair upon arrival  Transfers Overall transfer level: Needs assistance Equipment used: None Transfers: Sit to/from Stand Sit to Stand: Min guard        General transfer comment: Min guard for safety; no physical assist needed    Balance Overall balance assessment: Needs assistance Sitting-balance support: Feet supported;No upper extremity supported Sitting balance-Leahy Scale: Good     Standing balance support: No upper extremity supported;Single extremity supported;During functional activity Standing balance-Leahy Scale: Fair Standing balance comment: pt reliant on at least one UE support                            ADL either performed or assessed with clinical judgement   ADL Overall ADL's : Needs assistance/impaired Eating/Feeding: Set up;Sitting   Grooming: Min guard;Standing Grooming Details (indicate cue type and reason): Educated on use of 2 cups for oral care Upper Body Bathing: Set up;Supervision/ safety;Sitting   Lower Body Bathing: Min guard;Sit to/from stand   Upper Body Dressing : Set up;Supervision/safety;Sitting Upper Body Dressing Details (indicate cue type and reason): pt reports no difficulties donning brace Lower Body Dressing: Minimal assistance;Sit to/from stand Lower Body Dressing Details (indicate cue type and reason): Wife to assist with LB dressing as needed. Educated on compensatory strategies Toilet Transfer: Min guard;Ambulation;BSC Toilet Transfer Details (indicate cue type and reason): Simulated by sit to stand from chair with functional mobility. Educated on use of 3 in 1 over toilet   Toileting - Clothing Manipulation Details (indicate cue type and reason): Educated on no twisting for peri care and use of wet wipes Tub/ Shower Transfer: Min guard;Tub transfer;Ambulation Tub/Shower Transfer Details (indicate cue type and reason): Pt able to simulate tub transfer technique. Recommend supervision initially; wife agreeable Functional mobility during ADLs: Min guard General ADL Comments: Educated pt on maintaining back precuations during functional activities, keeping frequently used items at counter top height, log roll for bed mobility, and frequent mobility thorughout the day upon return home     Vision         Perception     Praxis      Pertinent Vitals/Pain Pain Assessment: 0-10 Pain Score: 10-Worst pain ever Faces  Pain Scale: Hurts even more Pain Location: back Pain Descriptors / Indicators: Sore;Guarding;Grimacing Pain Intervention(s): Monitored during session;Premedicated before session     Hand Dominance     Extremity/Trunk Assessment  Upper Extremity Assessment Upper Extremity Assessment: Overall WFL for tasks assessed   Lower Extremity Assessment Lower Extremity Assessment: Defer to PT evaluation   Cervical / Trunk Assessment Cervical / Trunk Assessment: Other exceptions Cervical / Trunk Exceptions: s/p lumbar sx   Communication Communication Communication: No difficulties   Cognition Arousal/Alertness: Awake/alert Behavior During Therapy: WFL for tasks assessed/performed Overall Cognitive Status: Within Functional Limits for tasks assessed                                     General Comments       Exercises     Shoulder Instructions      Home Living Family/patient expects to be discharged to:: Private residence Living Arrangements: Spouse/significant other Available Help at Discharge: Family;Available 24 hours/day Type of Home: House Home Access: Stairs to enter [employer] of Steps: 3 Entrance Stairs-Rails: None Home Layout: One level     Bathroom Shower/Tub: Tub/shower unit;Curtain   Firefighter: Standard     Home Equipment: Environmental consultant - 2 wheels;Bedside commode          Prior Functioning/Environment Level of Independence: Independent                 OT Problem List:        OT Treatment/Interventions:      OT Goals(Current goals can be found in the care plan section) Acute Rehab OT Goals Patient Stated Goal: return home, decrease pain OT Goal Formulation: All assessment and education complete, DC therapy  OT Frequency:     Barriers to D/C:            Co-evaluation              AM-PAC PT "6 Clicks" Daily Activity     Outcome Measure Help from another person eating meals?: None Help from another person taking care of personal grooming?: A Little Help from another person toileting, which includes using toliet, bedpan, or urinal?: A Little Help from another person bathing (including washing, rinsing, drying)?: A Little Help from another  person to put on and taking off regular upper body clothing?: A Little Help from another person to put on and taking off regular lower body clothing?: A Little 6 Click Score: 19   End of Session Equipment Utilized During Treatment: Back brace Nurse Communication: Mobility status;Other (comment) (pt back in room; ready for IV antibiotics)  Activity Tolerance: Patient tolerated treatment well Patient left: in chair;with call bell/phone within reach;with family/visitor present  OT Visit Diagnosis: Muscle weakness (generalized) (M62.81);Unsteadiness on feet (R26.81)                Time: 1527-1550 OT Time Calculation (min): 23 min Charges:  OT General Charges $OT Visit: 1 Procedure OT Evaluation $OT Eval Moderate Complexity: 1 Procedure OT Treatments $Self Care/Home Management : 8-22 mins G-Codes:     Thomas Meyers, M.S., OTR/L Pager: [phone]  Thomas Meyers 06/22/2016, 4:00 PM

## 2016-06-22 NOTE — Evaluation (Signed)
Physical Therapy Evaluation Patient Details Name: Thomas Meyers MRN: [id number] DOB: 1955-02-24 Today's Date: 06/22/2016   History of Present Illness  Pt is a 61 y/o male s/p L4-S1 PLIF. PMH including but not limited to HTN and R TKA in 2014.  Clinical Impression  Pt presented supine in bed with HOB elevated, awake and willing to participate in therapy session. Prior to admission, pt reported that he was independent with functional mobility and ADLs. Pt lives with his spouse who will be able to provide 24/7 supervision if needed. He has three steps to enter his home with no handrails. PT gave pt back precautions handout and previewed in full with pt and pt's spouse. Plan for stair training at next session. PT will continue to follow pt acutely to ensure a safe d/c home.    Follow Up Recommendations No PT follow up;Supervision/Assistance - 24 hour    Equipment Recommendations  None recommended by PT    Recommendations for Other Services       Precautions / Restrictions Precautions Precautions: Back Precaution Booklet Issued: Yes (comment) Precaution Comments: PT reviewed 3/3 back precautions with pt and pt's spouse Required Braces or Orthoses: Spinal Brace Spinal Brace: Lumbar corset;Applied in sitting position Restrictions Weight Bearing Restrictions: No      Mobility  Bed Mobility Overal bed mobility: Needs Assistance Bed Mobility: Rolling;Sidelying to Sit Rolling: Supervision Sidelying to sit: Min guard       General bed mobility comments: increased time, good technique  Transfers Overall transfer level: Needs assistance Equipment used: 1 person hand held assist Transfers: Sit to/from [employer] Sit to Stand: Min assist Stand pivot transfers: Min assist       General transfer comment: min A for stability with rise into standing from bed and with pivotal movement from bed to recliner chair.   Ambulation/Gait             General Gait  Details: pt with "funny feeling" in his head; therefore, limited to transfer. pt's lunch also present and was assisted in recliner chair to eat.  Stairs            Wheelchair Mobility    Modified Rankin (Stroke Patients Only)       Balance Overall balance assessment: Needs assistance Sitting-balance support: Feet supported Sitting balance-Leahy Scale: Good     Standing balance support: During functional activity;Single extremity supported Standing balance-Leahy Scale: Poor Standing balance comment: pt reliant on at least one UE support                             Pertinent Vitals/Pain Pain Assessment: Faces Faces Pain Scale: Hurts even more Pain Location: back Pain Descriptors / Indicators: Sore;Guarding;Grimacing Pain Intervention(s): Monitored during session;Repositioned    Home Living Family/patient expects to be discharged to:: Private residence Living Arrangements: Spouse/significant other Available Help at Discharge: Family;Available 24 hours/day Type of Home: House Home Access: Stairs to enter Entrance Stairs-Rails: None Entrance Stairs-Number of Steps: 3 Home Layout: One level Home Equipment: Walker - 2 wheels;Bedside commode      Prior Function Level of Independence: Independent               Hand Dominance        Extremity/Trunk Assessment   Upper Extremity Assessment Upper Extremity Assessment: Defer to OT evaluation    Lower Extremity Assessment Lower Extremity Assessment: Overall WFL for tasks assessed    Cervical / Trunk Assessment Cervical /  Trunk Assessment: Other exceptions Cervical / Trunk Exceptions: s/p lumbar sx  Communication   Communication: No difficulties  Cognition Arousal/Alertness: Awake/alert Behavior During Therapy: WFL for tasks assessed/performed Overall Cognitive Status: Within Functional Limits for tasks assessed                                        General Comments       Exercises     Assessment/Plan    PT Assessment Patient needs continued PT services  PT Problem List Decreased balance;Decreased mobility;Decreased coordination;Decreased knowledge of use of DME;Decreased safety awareness;Decreased knowledge of precautions;Pain       PT Treatment Interventions DME instruction;Gait training;Functional mobility training;Stair training;Therapeutic activities;Therapeutic exercise;Balance training;Neuromuscular re-education;Patient/family education    PT Goals (Current goals can be found in the Care Plan section)  Acute Rehab PT Goals Patient Stated Goal: return home, decrease pain PT Goal Formulation: With patient/family Time For Goal Achievement: 07/06/16 Potential to Achieve Goals: Good    Frequency Min 5X/week   Barriers to discharge        Co-evaluation               AM-PAC PT "6 Clicks" Daily Activity  Outcome Measure Difficulty turning over in bed (including adjusting bedclothes, sheets and blankets)?: A Little Difficulty moving from lying on back to sitting on the side of the bed? : A Little Difficulty sitting down on and standing up from a chair with arms (e.g., wheelchair, bedside commode, etc,.)?: A Little Help needed moving to and from a bed to chair (including a wheelchair)?: A Little Help needed walking in hospital room?: A Little Help needed climbing 3-5 steps with a railing? : A Little 6 Click Score: 18    End of Session Equipment Utilized During Treatment: Gait belt;Back brace Activity Tolerance: Patient tolerated treatment well Patient left: in chair;with call bell/phone within reach;with family/visitor present Nurse Communication: Mobility status PT Visit Diagnosis: Other abnormalities of gait and mobility (R26.89);Pain Pain - part of body:  (back)    Time: [id number] PT Time Calculation (min) (ACUTE ONLY): 10 min   Charges:   PT Evaluation $PT Eval Moderate Complexity: 1 Procedure     PT G Codes:         Deborah ChalkJennifer Quinlynn Cuthbert, PT, DPT [phone]   Alessandra BevelsJennifer M Zoha Spranger 06/22/2016, 3:09 PM

## 2016-06-22 NOTE — H&P (Signed)
  Thomas Meyers is an 61 y.o. male.   Chief Complaint: Back pain HPI: 61 year old male with intractable back pain with accompanying lower extremity symptoms left greater than right. Workup demonstrates evidence of a grade 1 L5-S1 lytic spondylolisthesis as well as significant disc degeneration and stenosis at L4-5. Patient has failed conservative management. He presents now for two-level decompression infusion in hopes of improving his symptoms.  Past Medical History:  Diagnosis Date  . Arthritis   . Hypertension   . Hypothyroidism   . PONV (postoperative nausea and vomiting)   . Shortness of breath    with exertion     Past Surgical History:  Procedure Laterality Date  . right knee arthroscoy     . TONSILLECTOMY    . TOTAL KNEE ARTHROPLASTY Right 03/21/2012   Procedure: TOTAL KNEE ARTHROPLASTY;  Surgeon: Thomas DrillingFrank V Aluisio, MD;  Location: [facility];  Service: Orthopedics;  Laterality: Right;    History reviewed. No pertinent family history. Social History:  reports that he has never smoked. His smokeless tobacco use includes Snuff. He reports that he drinks alcohol. He reports that he does not use drugs.  Allergies: No Known Allergies  Medications Prior to Admission  Medication Sig Dispense Refill  . acetaminophen (TYLENOL) 500 MG tablet Take 1,000 mg by mouth every 6 (six) hours as needed. Pain    . aspirin EC 81 MG tablet Take 81 mg by mouth daily.    Marland Kitchen. ibuprofen (ADVIL,MOTRIN) 200 MG tablet Take 400 mg by mouth every 6 (six) hours as needed for mild pain or moderate pain.    Marland Kitchen. levothyroxine (SYNTHROID, LEVOTHROID) 75 MCG tablet Take 75 mcg by mouth daily before breakfast.    . losartan-hydrochlorothiazide (HYZAAR) 100-12.5 MG per tablet Take 1 tablet by mouth daily before breakfast.    . metoprolol succinate (TOPROL-XL) 50 MG 24 hr tablet Take 50 mg by mouth daily before breakfast. Take with or immediately following a meal.    . pravastatin (PRAVACHOL) 40 MG tablet Take 40 mg by mouth  every evening.    . traMADol (ULTRAM) 50 MG tablet Take 1-2 tablets (50-100 mg total) by mouth every 6 (six) hours as needed. 80 tablet 1  . naproxen sodium (ALEVE) 220 MG tablet Take 440 mg by mouth 2 (two) times daily as needed.      No results found for this or any previous visit (from the past 48 hour(s)). No results found.  Pertinent items noted in HPI and remainder of comprehensive ROS otherwise negative.  Blood pressure 133/73, pulse (!) 59, temperature 98 F (36.7 C), temperature source Oral, resp. rate 20, weight 127.5 kg (281 lb), SpO2 98 %.  Patient is awake and alert. He is oriented and appropriate. His cranial nerve function is intact. His motor and sensory function extremities extremities are normal. Reflexes are hypoactive in both lower extremities but symmetric. No evidence of long track signs. Gait is antalgic. Posterior mildly flexed. Examination head ears eyes nose and throat is unremarkable. Chest and abdomen are benign. Extremities are free from injury or deformity. Assessment/Plan L4-5 degenerative disc disease with stenosis, L5-S1 grade 1 lytic spondylolisthesis with foraminal stenosis and radiculopathy. Plan bilateral L4-5 and L5-S1 decompression and fusion with interbody cages and locally harvested autograft coupled with posterior lateral arthrodesis utilizing segmental pedicle screw fixation and local autografting. Risks and benefits of explained. Patient wishes to proceed.  Shameka Aggarwal A 06/22/2016, 7:45 AM

## 2016-06-22 NOTE — Anesthesia Procedure Notes (Addendum)
Procedure Name: Intubation Date/Time: 06/22/2016 8:08 AM Performed by: Julieta Bellini Pre-anesthesia Checklist: Patient identified, Emergency Drugs available, Suction available and Patient being monitored Patient Re-evaluated:Patient Re-evaluated prior to inductionOxygen Delivery Method: Circle system utilized Preoxygenation: Pre-oxygenation with 100% oxygen Intubation Type: IV induction Ventilation: Mask ventilation without difficulty and Oral airway inserted - appropriate to patient size Laryngoscope Size: Mac and 4 Grade View: Grade II Tube type: Oral Tube size: 7.5 mm Number of attempts: 1 Airway Equipment and Method: Stylet Placement Confirmation: ETT inserted through vocal cords under direct vision,  positive ETCO2 and breath sounds checked- equal and bilateral Secured at: 23 cm Tube secured with: Tape Dental Injury: Teeth and Oropharynx as per pre-operative assessment

## 2016-06-22 NOTE — Brief Op Note (Signed)
06/22/2016  11:47 AM  PATIENT:  Thomas Meyers  61 y.o. male  PRE-OPERATIVE DIAGNOSIS:  Spndylolisthesis  POST-OPERATIVE DIAGNOSIS:  Spndylolisthesis  PROCEDURE:  Procedure(s): POSTERIOR LUMBAR INTERBODY FUSION - LUMBAR FOUR-FIVE LUMBAR FIVE-SACRAL ONE (N/A)  SURGEON:  Surgeon(s) and Role:    * Julio SicksPool, Olen Eaves, MD - Primary    * Tia AlertJones, David S, MD - Assisting  PHYSICIAN ASSISTANT:   ASSISTANTS:    ANESTHESIA:   general  EBL:  Total I/O In: 2150 [I.V.:1800; Blood:100; IV Piggyback:250] Out: 730 [Urine:130; Blood:600]  BLOOD ADMINISTERED:none  DRAINS: none   LOCAL MEDICATIONS USED:  MARCAINE     SPECIMEN:  No Specimen  DISPOSITION OF SPECIMEN:  N/A  COUNTS:  YES  TOURNIQUET:  * No tourniquets in log *  DICTATION: .Dragon Dictation  PLAN OF CARE: Admit to inpatient   PATIENT DISPOSITION:  PACU - hemodynamically stable.   Delay start of Pharmacological VTE agent (>24hrs) due to surgical blood loss or risk of bleeding: yes

## 2016-06-23 MED ORDER — HYDROCODONE-ACETAMINOPHEN 10-325 MG PO TABS: 1.0000 | ORAL_TABLET | ORAL | 0 refills | Status: AC | PRN

## 2016-06-23 MED ORDER — DIAZEPAM 5 MG PO TABS: 5.0000 mg | ORAL_TABLET | Freq: Four times a day (QID) | ORAL | 0 refills | Status: AC | PRN

## 2016-06-23 NOTE — Progress Notes (Signed)
Physical Therapy Treatment Patient Details Name: Thomas DrillingJohn F Meyers MRN: [id number] DOB: 01/27/1956 Today's Date: 06/23/2016    History of Present Illness Pt is a 61 y/o male s/p L4-S1 PLIF. PMH including but not limited to HTN and R TKA in 2014.    PT Comments    Pt progressing towards physical therapy goals. Was able to perform ambulation with min guard assist, and stair negotiation with up to mod assist without rails for support. Pt was educated on safe stair negotiation for home entrance, car transfer, brace wearing schedule, walking program, precautions, and general safety. Will continue to follow and progress as able per POC.   Follow Up Recommendations  No PT follow up;Supervision/Assistance - 24 hour     Equipment Recommendations  None recommended by PT    Recommendations for Other Services       Precautions / Restrictions Precautions Precautions: Back Precaution Booklet Issued: Yes (comment) Precaution Comments: Pt was cued for precautions during functional mobility.  Required Braces or Orthoses: Spinal Brace Spinal Brace: Lumbar corset;Applied in sitting position Restrictions Weight Bearing Restrictions: No    Mobility  Bed Mobility               General bed mobility comments: Pt received standing at the door, dressed and with brace donned.   Transfers                 General transfer comment: Pt asking to stay standing at end of session for pain control,  Ambulation/Gait Ambulation/Gait assistance: Min guard Ambulation Distance (Feet): 400 Feet Assistive device: None Gait Pattern/deviations: Step-through pattern;Decreased stride length;Trendelenburg Gait velocity: Decreased Gait velocity interpretation: Below normal speed for age/gender General Gait Details: Occasional unsteadiness noted during ambulation with wide BOS and UE's in high guard position. Close guard provided for safety however no assistance was required for pt to recover.     Stairs Stairs: Yes   Stair Management: One rail Right;No rails;Step to pattern;Forwards Number of Stairs: 3 General stair comments: Pt able to negotiate stairs well with railing for support. Without rail, pt with LOB requiring Mod A to recover and prevent fall. Discussed safety for home entrance and recommended HHA from wife to negotiate stairs.   Wheelchair Mobility    Modified Rankin (Stroke Patients Only)       Balance Overall balance assessment: Needs assistance Sitting-balance support: Feet supported;No upper extremity supported Sitting balance-Leahy Scale: Good     Standing balance support: No upper extremity supported;Single extremity supported;During functional activity Standing balance-Leahy Scale: Fair                              Cognition Arousal/Alertness: Awake/alert Behavior During Therapy: WFL for tasks assessed/performed Overall Cognitive Status: Within Functional Limits for tasks assessed                                        Exercises      General Comments        Pertinent Vitals/Pain Pain Assessment: 0-10 Pain Score: 4  Pain Location: back Pain Descriptors / Indicators: Sore;Grimacing;Operative site guarding Pain Intervention(s): Limited activity within patient's tolerance;Monitored during session;Repositioned    Home Living                      Prior Function  PT Goals (current goals can now be found in the care plan section) Acute Rehab PT Goals Patient Stated Goal: return home, decrease pain PT Goal Formulation: With patient/family Time For Goal Achievement: 07/06/16 Potential to Achieve Goals: Good Progress towards PT goals: Progressing toward goals    Frequency    Min 5X/week      PT Plan Current plan remains appropriate    Co-evaluation              AM-PAC PT "6 Clicks" Daily Activity  Outcome Measure  Difficulty turning over in bed (including adjusting  bedclothes, sheets and blankets)?: None Difficulty moving from lying on back to sitting on the side of the bed? : A Little Difficulty sitting down on and standing up from a chair with arms (e.g., wheelchair, bedside commode, etc,.)?: Total Help needed moving to and from a bed to chair (including a wheelchair)?: A Little Help needed walking in hospital room?: A Little Help needed climbing 3-5 steps with a railing? : A Little 6 Click Score: 17    End of Session Equipment Utilized During Treatment: Gait belt;Back brace Activity Tolerance: Patient tolerated treatment well Patient left: in chair;with call bell/phone within reach;with family/visitor present Nurse Communication: Mobility status PT Visit Diagnosis: Other abnormalities of gait and mobility (R26.89);Pain Pain - part of body:  (back)     Time: 0454-0981 PT Time Calculation (min) (ACUTE ONLY): 24 min  Charges:  $Gait Training: 23-37 mins                    G Codes:       Conni Slipper, PT, DPT Acute Rehabilitation Services Pager: [phone]    Marylynn Pearson 06/23/2016, 10:11 AM

## 2016-06-23 NOTE — Discharge Summary (Signed)
  Physician Discharge Summary  Patient ID: Thomas DrillingJohn F Meyers MRN: [id number] DOB/AGE: [birth year] 61 y.o.  Admit date: 06/22/2016 Discharge date: 06/23/2016  Admission Diagnoses:  Discharge Diagnoses:  Active Problems:   Degenerative spondylolisthesis   Discharged Condition: good  Hospital Course: Patient admitted to hospital where he underwent an uncomplicated two-level lumbar decompression and fusion. Postoperatively the patient is done very well. Preoperative back and lower extremity pain much improved. Standing and walking without difficulty. Patient happy with his progress. Ready for discharge home.  Consults:   Significant Diagnostic Studies:   Treatments:   Discharge Exam: Blood pressure 112/66, pulse 84, temperature 99.4 F (37.4 C), temperature source Oral, resp. rate 17, height 5\' 11"  (1.803 m), weight 128 kg (282 lb 1.6 oz), SpO2 98 %. Awake and alert. Oriented and appropriate. Cranial nerve function intact. Motor and sensory function extremities normal. Wound clean and dry. Chest and abdomen benign.  Disposition: 06-Home-Health Care Svc   Allergies as of 06/23/2016   No Known Allergies     Medication List    TAKE these medications   acetaminophen 500 MG tablet Commonly known as:  TYLENOL Take 1,000 mg by mouth every 6 (six) hours as needed. Pain   ALEVE 220 MG tablet Generic drug:  naproxen sodium Take 440 mg by mouth 2 (two) times daily as needed.   aspirin EC 81 MG tablet Take 81 mg by mouth daily.   diazepam 5 MG tablet Commonly known as:  VALIUM Take 1-2 tablets (5-10 mg total) by mouth every 6 (six) hours as needed for muscle spasms.   HYDROcodone-acetaminophen 10-325 MG tablet Commonly known as:  NORCO Take 1-2 tablets by mouth every 4 (four) hours as needed (breakthrough pain).   ibuprofen 200 MG tablet Commonly known as:  ADVIL,MOTRIN Take 400 mg by mouth every 6 (six) hours as needed for mild pain or moderate pain.   levothyroxine 75 MCG  tablet Commonly known as:  SYNTHROID, LEVOTHROID Take 75 mcg by mouth daily before breakfast.   losartan-hydrochlorothiazide 100-12.5 MG tablet Commonly known as:  HYZAAR Take 1 tablet by mouth daily before breakfast.   metoprolol succinate 50 MG 24 hr tablet Commonly known as:  TOPROL-XL Take 50 mg by mouth daily before breakfast. Take with or immediately following a meal.   pravastatin 40 MG tablet Commonly known as:  PRAVACHOL Take 40 mg by mouth every evening.   traMADol 50 MG tablet Commonly known as:  ULTRAM Take 1-2 tablets (50-100 mg total) by mouth every 6 (six) hours as needed.            Durable Medical Equipment        Start     Ordered   06/22/16 1355  DME Walker rolling  Once    Question:  Patient needs a walker to treat with the following condition  Answer:  Degenerative spondylolisthesis   06/22/16 1354   06/22/16 1355  DME 3 n 1  Once     06/22/16 1354       Signed: Thyra Yinger A 06/23/2016, 9:00 AM

## 2016-06-23 NOTE — Progress Notes (Signed)
Patient is discharged from room 3C08 at this time. Alert and in stable condition. IV site d/c'd and instructions read to patient and wife with understanding verbalized. Left unit via wheelchair with all belongings at side. 

## 2016-06-23 NOTE — Discharge Instructions (Signed)

## 2016-07-09 NOTE — Addendum Note (Signed)
Addendum  created 07/09/16 1536 by Val EagleMoser, Jeran Hiltz, MD   Sign clinical note

## 2017-01-04 ENCOUNTER — Other Ambulatory Visit: Payer: Self-pay | Admitting: Neurosurgery

## 2017-01-04 DIAGNOSIS — M4317 Spondylolisthesis, lumbosacral region: Secondary | ICD-10-CM

## 2017-01-09 ENCOUNTER — Ambulatory Visit
Admission: RE | Admit: 2017-01-09 | Discharge: 2017-01-09 | Disposition: A | Discharge status: Home / Self Care | Payer: Self-pay | Source: Ambulatory Visit | Attending: Neurosurgery | Admitting: Neurosurgery

## 2017-01-09 DIAGNOSIS — M4317 Spondylolisthesis, lumbosacral region: Secondary | ICD-10-CM

## 2018-08-07 IMAGING — CT CT L SPINE W/O CM
3 of 12 series · 8 of 33 positions shown, 10 images · non-contrast
Comparison: CT myelogram 04/26/2016.

CLINICAL DATA: Persistent low back pain to the hips despite lumbar
spine surgery June 2016.

EXAM:
CT LUMBAR SPINE WITHOUT CONTRAST
TECHNIQUE: Multidetector CT imaging of the lumbar spine was performed without
intravenous contrast administration. Multiplanar CT image
reconstructions were also generated.

[Series 5: l-spine detail · axial · 0.41mm/px · z∈[+10,+93]mm · 2 of 99 slices shown, 3 images]
[im 33/99  soft-tissue]
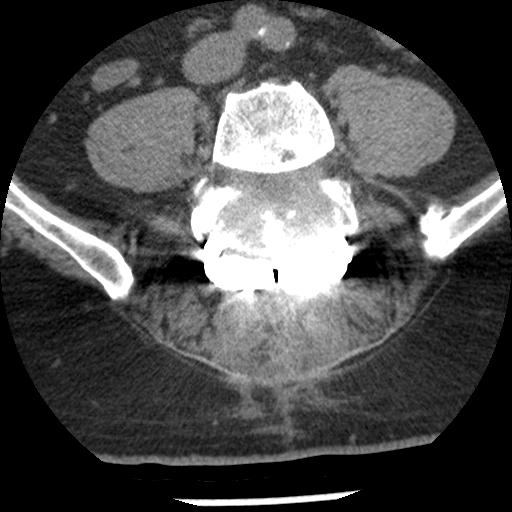
[im 33/99  bone]
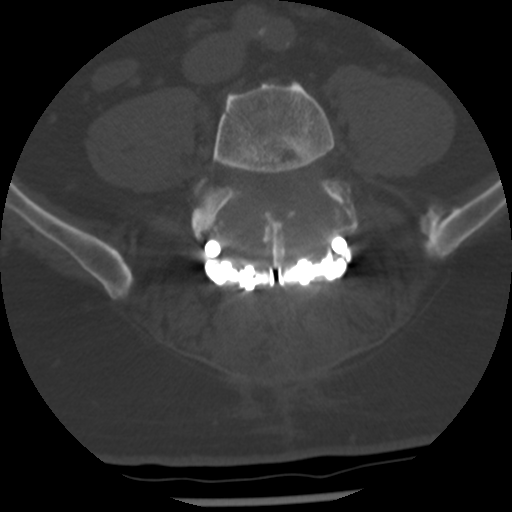
[im 66/99  bone]
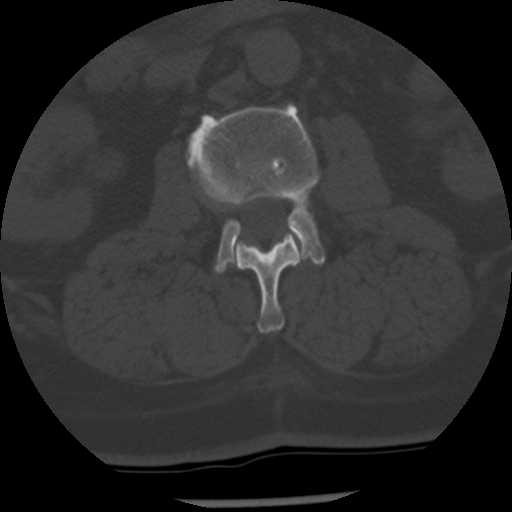

[Series 201: cor 2 · coronal · 0.50mm/px · 1 of 82 slices shown]
[im 41/82  bone]
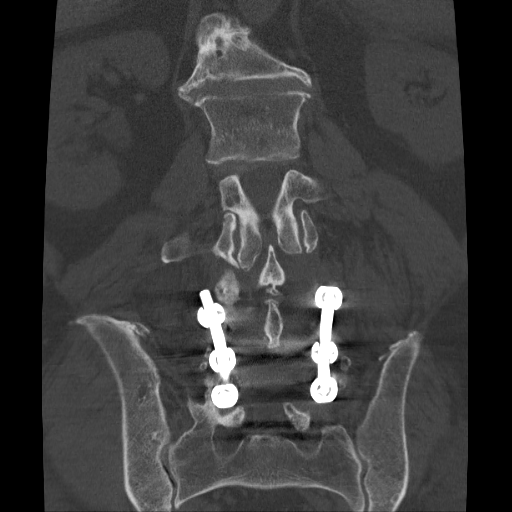

[Series 202: sag · sagittal · 0.50mm/px · 5 of 82 slices shown, 6 images]
[im 28/82  bone]
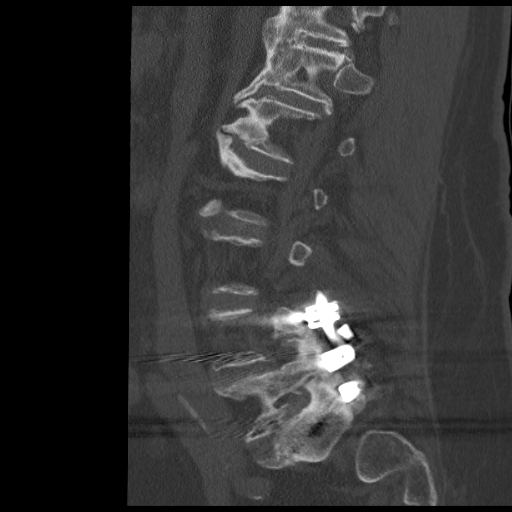
[im 34/82  bone]
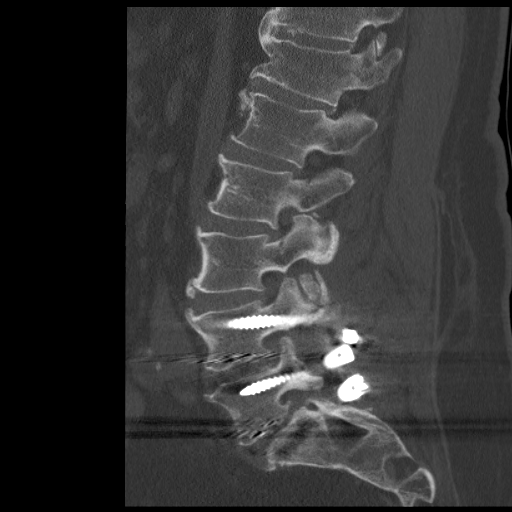
[im 41/82  soft-tissue]
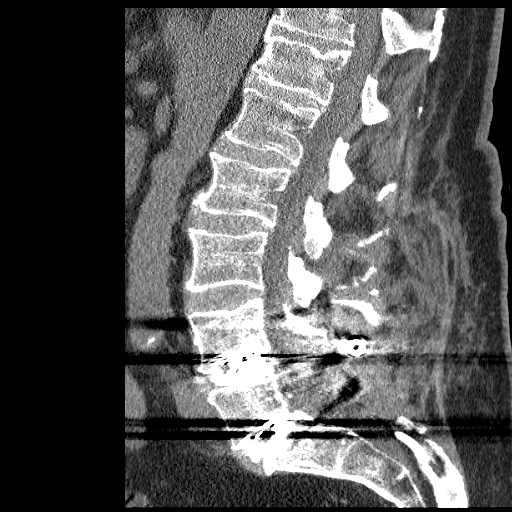
[im 41/82  bone]
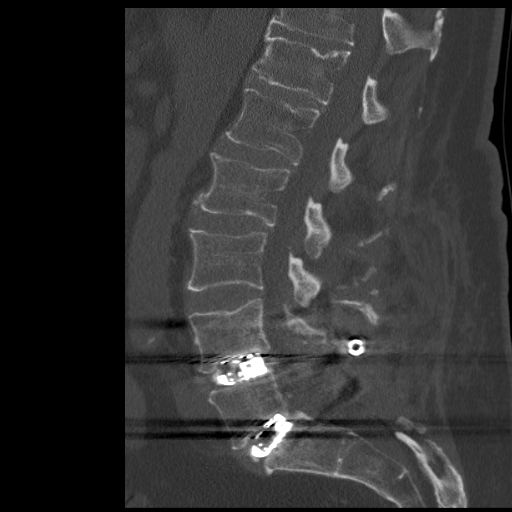
[im 48/82  bone]
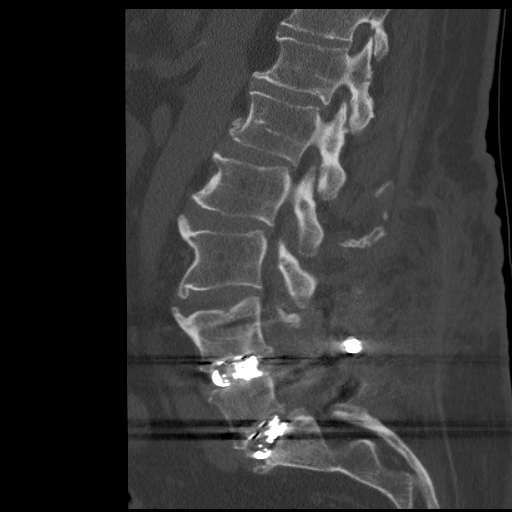
[im 55/82  bone]
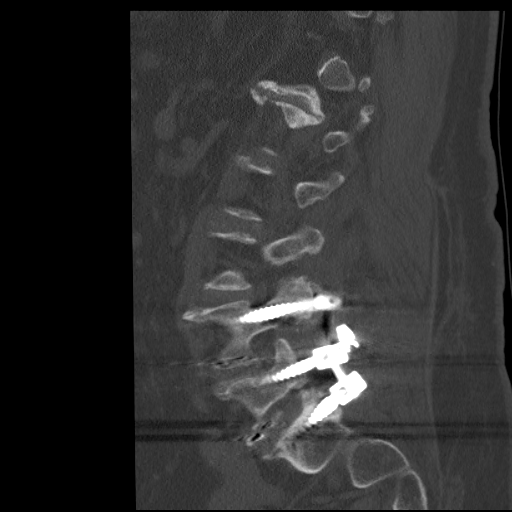

[8 of 33 positions shown; findings below may reference images not displayed]

FINDINGS: Segmentation: 5 non rib-bearing lumbar type vertebral bodies are
present.

Alignment: Minimal retrolisthesis tubes is present at L2-3. AP
alignment is otherwise anatomic.

Vertebrae: L4 and L5 laminectomies are noted. Vertebral body heights
are maintained.

Paraspinal and other soft tissues: Atherosclerotic changes are
present in the distal aorta and branch vessels without aneurysm.

Disc levels: T12-L1: Mild facet hypertrophy is present without
significant stenosis.

L1-2: Mild facet hypertrophy is present. No focal stenosis is
evident.

L2-3: Mild disc bulging is present. Moderate facet hypertrophy is
worse on the right. Slight retrolisthesis contributes to mild
foraminal stenosis bilaterally. This has progressed some.

L3-4: Mild disc bulging is present. Moderate facet hypertrophy is
stable, right greater than left. No significant stenosis or change
is present.

L4-5: Laminectomy is noted. Central canal is decompressed. Bilateral
foraminotomies have been performed. No osseous stenosis is present.

L5-S1: A wide laminectomy is noted. No residual central or foraminal
stenosis is present.
IMPRESSION: 1. Progressive multiple osseous foraminal stenosis bilaterally at
L2-3 with moderate bilateral facet hypertrophy.
2. Mild disc bulging and right greater than left set hypertrophy at
L3-4 without significant focal stenosis.
3. Laminectomies at L4 and L5 without residual or recurrent stenosis
at L4-5 or L5-S1.

## 2022-01-22 DIAGNOSIS — M25512 Pain in left shoulder: Secondary | ICD-10-CM | POA: Diagnosis not present

## 2022-03-07 DIAGNOSIS — M75122 Complete rotator cuff tear or rupture of left shoulder, not specified as traumatic: Secondary | ICD-10-CM | POA: Diagnosis not present

## 2022-03-15 DIAGNOSIS — E039 Hypothyroidism, unspecified: Secondary | ICD-10-CM | POA: Diagnosis not present

## 2022-03-15 DIAGNOSIS — E7801 Familial hypercholesterolemia: Secondary | ICD-10-CM | POA: Diagnosis not present

## 2022-03-15 DIAGNOSIS — I10 Essential (primary) hypertension: Secondary | ICD-10-CM | POA: Diagnosis not present

## 2022-03-15 DIAGNOSIS — Z131 Encounter for screening for diabetes mellitus: Secondary | ICD-10-CM | POA: Diagnosis not present

## 2022-03-19 DIAGNOSIS — M6281 Muscle weakness (generalized): Secondary | ICD-10-CM | POA: Diagnosis not present

## 2022-03-19 DIAGNOSIS — M25512 Pain in left shoulder: Secondary | ICD-10-CM | POA: Diagnosis not present

## 2022-03-21 DIAGNOSIS — M25512 Pain in left shoulder: Secondary | ICD-10-CM | POA: Diagnosis not present

## 2022-03-21 DIAGNOSIS — M6281 Muscle weakness (generalized): Secondary | ICD-10-CM | POA: Diagnosis not present

## 2022-04-02 DIAGNOSIS — M25512 Pain in left shoulder: Secondary | ICD-10-CM | POA: Diagnosis not present

## 2022-04-02 DIAGNOSIS — E871 Hypo-osmolality and hyponatremia: Secondary | ICD-10-CM | POA: Diagnosis not present

## 2022-04-02 DIAGNOSIS — M6281 Muscle weakness (generalized): Secondary | ICD-10-CM | POA: Diagnosis not present

## 2022-04-05 DIAGNOSIS — M6281 Muscle weakness (generalized): Secondary | ICD-10-CM | POA: Diagnosis not present

## 2022-04-05 DIAGNOSIS — M25512 Pain in left shoulder: Secondary | ICD-10-CM | POA: Diagnosis not present

## 2022-04-10 DIAGNOSIS — M6281 Muscle weakness (generalized): Secondary | ICD-10-CM | POA: Diagnosis not present

## 2022-04-10 DIAGNOSIS — M25512 Pain in left shoulder: Secondary | ICD-10-CM | POA: Diagnosis not present

## 2022-04-12 DIAGNOSIS — M25512 Pain in left shoulder: Secondary | ICD-10-CM | POA: Diagnosis not present

## 2022-04-12 DIAGNOSIS — M6281 Muscle weakness (generalized): Secondary | ICD-10-CM | POA: Diagnosis not present

## 2022-04-13 DIAGNOSIS — Z96651 Presence of right artificial knee joint: Secondary | ICD-10-CM | POA: Diagnosis not present

## 2022-04-13 DIAGNOSIS — Z96652 Presence of left artificial knee joint: Secondary | ICD-10-CM | POA: Diagnosis not present

## 2022-04-18 DIAGNOSIS — M6281 Muscle weakness (generalized): Secondary | ICD-10-CM | POA: Diagnosis not present

## 2022-04-18 DIAGNOSIS — M25512 Pain in left shoulder: Secondary | ICD-10-CM | POA: Diagnosis not present

## 2022-04-24 DIAGNOSIS — M6281 Muscle weakness (generalized): Secondary | ICD-10-CM | POA: Diagnosis not present

## 2022-04-24 DIAGNOSIS — M25512 Pain in left shoulder: Secondary | ICD-10-CM | POA: Diagnosis not present

## 2022-04-26 DIAGNOSIS — Z6841 Body Mass Index (BMI) 40.0 and over, adult: Secondary | ICD-10-CM | POA: Diagnosis not present

## 2022-04-26 DIAGNOSIS — M25519 Pain in unspecified shoulder: Secondary | ICD-10-CM | POA: Diagnosis not present

## 2022-04-26 DIAGNOSIS — M25512 Pain in left shoulder: Secondary | ICD-10-CM | POA: Diagnosis not present

## 2022-04-26 DIAGNOSIS — E039 Hypothyroidism, unspecified: Secondary | ICD-10-CM | POA: Diagnosis not present

## 2022-04-26 DIAGNOSIS — I10 Essential (primary) hypertension: Secondary | ICD-10-CM | POA: Diagnosis not present

## 2022-04-26 DIAGNOSIS — E7801 Familial hypercholesterolemia: Secondary | ICD-10-CM | POA: Diagnosis not present

## 2022-04-26 DIAGNOSIS — M6281 Muscle weakness (generalized): Secondary | ICD-10-CM | POA: Diagnosis not present

## 2022-05-01 DIAGNOSIS — M25512 Pain in left shoulder: Secondary | ICD-10-CM | POA: Diagnosis not present

## 2022-05-01 DIAGNOSIS — M6281 Muscle weakness (generalized): Secondary | ICD-10-CM | POA: Diagnosis not present

## 2022-05-03 DIAGNOSIS — M6281 Muscle weakness (generalized): Secondary | ICD-10-CM | POA: Diagnosis not present

## 2022-05-03 DIAGNOSIS — M25512 Pain in left shoulder: Secondary | ICD-10-CM | POA: Diagnosis not present

## 2022-05-08 DIAGNOSIS — M25512 Pain in left shoulder: Secondary | ICD-10-CM | POA: Diagnosis not present

## 2022-05-08 DIAGNOSIS — M6281 Muscle weakness (generalized): Secondary | ICD-10-CM | POA: Diagnosis not present

## 2022-05-15 DIAGNOSIS — M6281 Muscle weakness (generalized): Secondary | ICD-10-CM | POA: Diagnosis not present

## 2022-05-15 DIAGNOSIS — M25512 Pain in left shoulder: Secondary | ICD-10-CM | POA: Diagnosis not present

## 2022-05-17 DIAGNOSIS — M25512 Pain in left shoulder: Secondary | ICD-10-CM | POA: Diagnosis not present

## 2022-05-17 DIAGNOSIS — M6281 Muscle weakness (generalized): Secondary | ICD-10-CM | POA: Diagnosis not present

## 2022-05-23 DIAGNOSIS — M25512 Pain in left shoulder: Secondary | ICD-10-CM | POA: Diagnosis not present

## 2022-05-23 DIAGNOSIS — M6281 Muscle weakness (generalized): Secondary | ICD-10-CM | POA: Diagnosis not present

## 2022-05-29 DIAGNOSIS — M6281 Muscle weakness (generalized): Secondary | ICD-10-CM | POA: Diagnosis not present

## 2022-05-29 DIAGNOSIS — M25512 Pain in left shoulder: Secondary | ICD-10-CM | POA: Diagnosis not present

## 2022-06-05 DIAGNOSIS — M6281 Muscle weakness (generalized): Secondary | ICD-10-CM | POA: Diagnosis not present

## 2022-06-05 DIAGNOSIS — M25512 Pain in left shoulder: Secondary | ICD-10-CM | POA: Diagnosis not present

## 2022-08-22 DIAGNOSIS — R5383 Other fatigue: Secondary | ICD-10-CM | POA: Diagnosis not present

## 2022-08-22 DIAGNOSIS — I10 Essential (primary) hypertension: Secondary | ICD-10-CM | POA: Diagnosis not present

## 2022-08-22 DIAGNOSIS — E7801 Familial hypercholesterolemia: Secondary | ICD-10-CM | POA: Diagnosis not present

## 2022-08-22 DIAGNOSIS — Z131 Encounter for screening for diabetes mellitus: Secondary | ICD-10-CM | POA: Diagnosis not present

## 2023-01-07 DIAGNOSIS — E7849 Other hyperlipidemia: Secondary | ICD-10-CM | POA: Diagnosis not present

## 2023-01-07 DIAGNOSIS — I10 Essential (primary) hypertension: Secondary | ICD-10-CM | POA: Diagnosis not present

## 2023-01-07 DIAGNOSIS — E039 Hypothyroidism, unspecified: Secondary | ICD-10-CM | POA: Diagnosis not present

## 2023-01-07 DIAGNOSIS — E871 Hypo-osmolality and hyponatremia: Secondary | ICD-10-CM | POA: Diagnosis not present

## 2023-01-07 DIAGNOSIS — Z125 Encounter for screening for malignant neoplasm of prostate: Secondary | ICD-10-CM | POA: Diagnosis not present

## 2023-01-07 DIAGNOSIS — Z13220 Encounter for screening for lipoid disorders: Secondary | ICD-10-CM | POA: Diagnosis not present

## 2023-01-07 DIAGNOSIS — Z1321 Encounter for screening for nutritional disorder: Secondary | ICD-10-CM | POA: Diagnosis not present

## 2023-01-07 DIAGNOSIS — Z0001 Encounter for general adult medical examination with abnormal findings: Secondary | ICD-10-CM | POA: Diagnosis not present

## 2023-01-07 DIAGNOSIS — Z131 Encounter for screening for diabetes mellitus: Secondary | ICD-10-CM | POA: Diagnosis not present

## 2023-01-10 DIAGNOSIS — M25519 Pain in unspecified shoulder: Secondary | ICD-10-CM | POA: Diagnosis not present

## 2023-01-10 DIAGNOSIS — I10 Essential (primary) hypertension: Secondary | ICD-10-CM | POA: Diagnosis not present

## 2023-01-10 DIAGNOSIS — E039 Hypothyroidism, unspecified: Secondary | ICD-10-CM | POA: Diagnosis not present

## 2023-01-10 DIAGNOSIS — Z0001 Encounter for general adult medical examination with abnormal findings: Secondary | ICD-10-CM | POA: Diagnosis not present

## 2023-01-10 DIAGNOSIS — Z6841 Body Mass Index (BMI) 40.0 and over, adult: Secondary | ICD-10-CM | POA: Diagnosis not present

## 2023-01-10 DIAGNOSIS — E7801 Familial hypercholesterolemia: Secondary | ICD-10-CM | POA: Diagnosis not present

## 2023-07-10 DIAGNOSIS — I10 Essential (primary) hypertension: Secondary | ICD-10-CM | POA: Diagnosis not present

## 2023-07-10 DIAGNOSIS — R5383 Other fatigue: Secondary | ICD-10-CM | POA: Diagnosis not present

## 2023-07-10 DIAGNOSIS — Z0001 Encounter for general adult medical examination with abnormal findings: Secondary | ICD-10-CM | POA: Diagnosis not present

## 2023-07-10 DIAGNOSIS — Z131 Encounter for screening for diabetes mellitus: Secondary | ICD-10-CM | POA: Diagnosis not present

## 2023-10-03 DIAGNOSIS — L814 Other melanin hyperpigmentation: Secondary | ICD-10-CM | POA: Diagnosis not present

## 2023-10-03 DIAGNOSIS — I781 Nevus, non-neoplastic: Secondary | ICD-10-CM | POA: Diagnosis not present

## 2023-10-03 DIAGNOSIS — L821 Other seborrheic keratosis: Secondary | ICD-10-CM | POA: Diagnosis not present

## 2023-10-03 DIAGNOSIS — L570 Actinic keratosis: Secondary | ICD-10-CM | POA: Diagnosis not present

## 2024-01-08 DIAGNOSIS — E039 Hypothyroidism, unspecified: Secondary | ICD-10-CM | POA: Diagnosis not present

## 2024-01-08 DIAGNOSIS — Z125 Encounter for screening for malignant neoplasm of prostate: Secondary | ICD-10-CM | POA: Diagnosis not present

## 2024-01-08 DIAGNOSIS — E871 Hypo-osmolality and hyponatremia: Secondary | ICD-10-CM | POA: Diagnosis not present

## 2024-01-08 DIAGNOSIS — Z131 Encounter for screening for diabetes mellitus: Secondary | ICD-10-CM | POA: Diagnosis not present

## 2024-01-08 DIAGNOSIS — Z13220 Encounter for screening for lipoid disorders: Secondary | ICD-10-CM | POA: Diagnosis not present

## 2024-01-08 DIAGNOSIS — R5383 Other fatigue: Secondary | ICD-10-CM | POA: Diagnosis not present

## 2024-01-08 DIAGNOSIS — E559 Vitamin D deficiency, unspecified: Secondary | ICD-10-CM | POA: Diagnosis not present
